# Patient Record
Sex: Female | Born: 1995
Health system: Southern US, Community
[De-identification: ages and names within clinical notes are randomized; demographics above are authoritative.]

## PROBLEM LIST (undated history)

## (undated) DIAGNOSIS — S93401A Sprain of unspecified ligament of right ankle, initial encounter: Secondary | ICD-10-CM

## (undated) DIAGNOSIS — K37 Unspecified appendicitis: Secondary | ICD-10-CM

## (undated) DIAGNOSIS — S76319A Strain of muscle, fascia and tendon of the posterior muscle group at thigh level, unspecified thigh, initial encounter: Secondary | ICD-10-CM

## (undated) DIAGNOSIS — S52599A Other fractures of lower end of unspecified radius, initial encounter for closed fracture: Secondary | ICD-10-CM

## (undated) DIAGNOSIS — Z789 Other specified health status: Secondary | ICD-10-CM

## (undated) DIAGNOSIS — IMO0002 Reserved for concepts with insufficient information to code with codable children: Secondary | ICD-10-CM

## (undated) DIAGNOSIS — S93409A Sprain of unspecified ligament of unspecified ankle, initial encounter: Secondary | ICD-10-CM

## (undated) DIAGNOSIS — S63639A Sprain of interphalangeal joint of unspecified finger, initial encounter: Secondary | ICD-10-CM

## (undated) HISTORY — DX: Strain of muscle, fascia and tendon of the posterior muscle group at thigh level, unspecified thigh, initial encounter: S76.319A

## (undated) HISTORY — DX: Unspecified appendicitis: K37

## (undated) HISTORY — DX: Reserved for concepts with insufficient information to code with codable children: IMO0002

## (undated) HISTORY — DX: Sprain of interphalangeal joint of unspecified finger, initial encounter: S63.639A

## (undated) HISTORY — DX: Other fractures of lower end of unspecified radius, initial encounter for closed fracture: S52.599A

## (undated) HISTORY — DX: Sprain of unspecified ligament of right ankle, initial encounter: S93.401A

## (undated) HISTORY — DX: Sprain of unspecified ligament of unspecified ankle, initial encounter: S93.409A

---
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
## 2015-05-13 DIAGNOSIS — N946 Dysmenorrhea, unspecified: Secondary | ICD-10-CM | POA: Diagnosis not present

## 2015-05-13 DIAGNOSIS — Z01419 Encounter for gynecological examination (general) (routine) without abnormal findings: Secondary | ICD-10-CM | POA: Diagnosis not present

## 2015-05-13 DIAGNOSIS — Z113 Encounter for screening for infections with a predominantly sexual mode of transmission: Secondary | ICD-10-CM | POA: Diagnosis not present

## 2015-05-13 DIAGNOSIS — Z130 Encounter for screening for diseases of the blood and blood-forming organs and certain disorders involving the immune mechanism: Secondary | ICD-10-CM | POA: Diagnosis not present

## 2015-05-13 DIAGNOSIS — Z202 Contact with and (suspected) exposure to infections with a predominantly sexual mode of transmission: Secondary | ICD-10-CM | POA: Diagnosis not present

## 2015-05-13 DIAGNOSIS — Z6823 Body mass index (BMI) 23.0-23.9, adult: Secondary | ICD-10-CM | POA: Diagnosis not present

## 2015-05-13 DIAGNOSIS — Z1389 Encounter for screening for other disorder: Secondary | ICD-10-CM | POA: Diagnosis not present

## 2015-05-13 DIAGNOSIS — Z3009 Encounter for other general counseling and advice on contraception: Secondary | ICD-10-CM | POA: Diagnosis not present

## 2015-05-13 DIAGNOSIS — N920 Excessive and frequent menstruation with regular cycle: Secondary | ICD-10-CM | POA: Diagnosis not present

## 2015-05-16 MED FILL — AZITHROMYCIN 500 MG TABLET: 500 | 1 days supply | Qty: 2 | Fill #0

## 2015-05-20 ENCOUNTER — Ambulatory Visit (INDEPENDENT_AMBULATORY_CARE_PROVIDER_SITE_OTHER): Payer: 59

## 2015-05-20 ENCOUNTER — Ambulatory Visit (INDEPENDENT_AMBULATORY_CARE_PROVIDER_SITE_OTHER): Payer: 59 | Admitting: Physician Assistant

## 2015-05-20 VITALS — BP 121/77 | HR 64 | Temp 97.9°F | Resp 16 | Ht 69.0 in | Wt 161.0 lb

## 2015-05-20 DIAGNOSIS — M25471 Effusion, right ankle: Secondary | ICD-10-CM | POA: Diagnosis not present

## 2015-05-20 DIAGNOSIS — S99911A Unspecified injury of right ankle, initial encounter: Secondary | ICD-10-CM | POA: Diagnosis not present

## 2015-05-20 DIAGNOSIS — S93401A Sprain of unspecified ligament of right ankle, initial encounter: Secondary | ICD-10-CM

## 2015-05-20 DIAGNOSIS — M25571 Pain in right ankle and joints of right foot: Secondary | ICD-10-CM | POA: Diagnosis not present

## 2015-05-20 DIAGNOSIS — S99921A Unspecified injury of right foot, initial encounter: Secondary | ICD-10-CM | POA: Diagnosis not present

## 2015-05-20 DIAGNOSIS — Z23 Encounter for immunization: Secondary | ICD-10-CM | POA: Diagnosis not present

## 2015-05-20 NOTE — Patient Instructions (Addendum)
Because you received an x-ray today, you will receive an invoice from [facility]. Please contact [facility] at [phone] with questions or concerns regarding your invoice. Our billing staff will not be able to assist you with those questions.  Use the boot and the crutches to prevent any weight on the ankle until you find that you can support your weight in the boot alone without pain. When you can bear weight on the ankle without the boot (you can test this out when you are in the shower), you can eliminate it.  Once your pain is resolved, resume the exercises you have to strengthen the ankle and reduce the risk of repeat injury.

## 2015-05-20 NOTE — Progress Notes (Signed)
Subjective:   Patient ID: Angela Blake, female     DOB: March 29, 1996, 20 y.o.    MRN: [id number]  PCP: Elon Jester, MD  Chief Complaint  Patient presents with  . Ankle Injury    right    HPI  Presents for evaluation of RIGHT lateral ankle pain and swelling after inversion injury today.  She is accompanied by a friend, and her father is also here, but not in the exam room the entire time.  She was walking on somewhat uneven ground in lace-up shoes with a 1.5 inch wedge-type heel. Has sprained both ankles previously, this one multiple times. Pain prevents ambulation, and she is brought into the office in a wheel chair.  Prior to Admission medications   Not on File     No Known Allergies   Patient Active Problem List   Diagnosis Date Noted  . Right ankle sprain 02/09/2012  . Strain of hip and thigh 12/28/2011  . Hamstring muscle strain 08/04/2011  . CLOS FRACTURE MID/PROXIMAL PHALANX/PHALANG HAND 03/24/2010  . SPRAIN AND STRAIN OF INTERPHALANGEAL OF HAND 03/24/2010  . OTHER CLOSED FRACTURES OF DISTAL END OF RADIUS 10/30/2009  . CONTUSION OF WRIST 10/30/2009  . CONTUSION OF KNEE 01/06/2009  . SPRAIN/STRAIN, ANKLE NOS 01/26/2007  . PAIN IN JOINT, ANKLE/FOOT 01/19/2007     No family history on file.   Social History   Social History  . Marital Status: Single    Spouse Name: n/a  . Number of Children: 0  . Years of Education: N/A   Occupational History  . 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 Smoking status: Never Smoker   . Smokeless tobacco: Never Used  . Alcohol Use: No  . Drug Use: No  . Sexual Activity: Not on file   Other Topics Concern  . Not on file   Social History Narrative   Lives on campus at [place]. Parents live locally.        Review of Systems As above.      Objective:  Physical Exam  Constitutional: She is oriented to person, place, and time. She appears well-developed and  well-nourished. She is active and cooperative. No distress.  BP 121/77 mmHg  Pulse 64  Temp(Src) 97.9 F (36.6 C)  Resp 16  Ht  (1.753 m)  Wt 161 lb (73.029 kg)  BMI 23.76 kg/m2   Eyes: Conjunctivae are normal.  Pulmonary/Chest: Effort normal.  Musculoskeletal:       Right ankle: She exhibits decreased range of motion and swelling. She exhibits no ecchymosis, no laceration and normal pulse. Tenderness. Lateral malleolus, AITFL, CF ligament, posterior TFL and head of 5th metatarsal tenderness found. No medial malleolus and no proximal fibula tenderness found. Achilles tendon normal.       Right foot: There is bony tenderness (5th metarsal). There is normal range of motion, no tenderness, no swelling, normal capillary refill, no crepitus and no laceration.  Neurological: She is alert and oriented to person, place, and time.  Skin: Skin is warm, dry and intact. No abrasion, no ecchymosis and no rash noted. No cyanosis. Nails show no clubbing.  Psychiatric: She has a normal mood and affect. Her speech is normal and behavior is normal.    Dg Ankle Complete Right  05/20/2015  CLINICAL DATA:  Pain and swelling following inversion injury earlier today EXAM: RIGHT ANKLE - COMPLETE 3+ VIEW COMPARISON:  February 09, 2012 FINDINGS: Frontal, oblique,  and lateral views were obtained. There is diffuse swelling laterally. There is a small joint effusion. No fracture is evident. Ankle mortise appears intact. No appreciable joint space narrowing. IMPRESSION: Marked swelling laterally with small joint effusion. Suspect a degree of ligamentous injury. No evident fracture. Mortise appears intact. Electronically Signed   By: Bretta Bang III M.D.   On: 05/20/2015 17:58   Dg Foot Complete Right  05/20/2015  CLINICAL DATA:  Right foot injury today. EXAM: RIGHT FOOT COMPLETE - 3+ VIEW COMPARISON:  None. FINDINGS: There is no evidence of fracture or dislocation. There is no evidence of arthropathy or other  focal bone abnormality. Soft tissues are unremarkable. IMPRESSION: Negative. Electronically Signed   By: Sherian Rein M.D.   On: 05/20/2015 17:58            Assessment & Plan:  1. Right ankle sprain, initial encounter No bony deformity of x-rays today. Short CAM walker. Crutches. Non-weight bearing, advance as tolerated. Rest, ice, elevate. RTC in 4 weeks if any pain persists, sooner if not progressing. Once pain is resolved, resume HEP for ankle strengthening to reduce risk of re-injury. - DG Ankle Complete Right; Future - DG Foot Complete Right; Future  2. Need for influenza vaccination - Flu Vaccine QUAD 36+ mos IM   Fernande Bras, PA-C Physician Assistant-Certified [facility]

## 2015-05-21 DIAGNOSIS — Z3202 Encounter for pregnancy test, result negative: Secondary | ICD-10-CM | POA: Diagnosis not present

## 2015-05-21 DIAGNOSIS — Z3046 Encounter for surveillance of implantable subdermal contraceptive: Secondary | ICD-10-CM | POA: Diagnosis not present

## 2015-08-11 DIAGNOSIS — Z3046 Encounter for surveillance of implantable subdermal contraceptive: Secondary | ICD-10-CM | POA: Diagnosis not present

## 2015-10-22 DIAGNOSIS — H5203 Hypermetropia, bilateral: Secondary | ICD-10-CM | POA: Diagnosis not present

## 2016-01-25 ENCOUNTER — Emergency Department
Admission: EM | Admit: 2016-01-25 | Discharge: 2016-01-25 | Disposition: A | Discharge status: Home / Self Care | Payer: 59 | Attending: Emergency Medicine | Admitting: Emergency Medicine

## 2016-01-25 ENCOUNTER — Observation Stay (HOSPITAL_COMMUNITY)
Admission: EM | Admit: 2016-01-25 | Discharge: 2016-01-27 | Disposition: A | Discharge status: Home / Self Care | Payer: 59 | Attending: Surgery | Admitting: Surgery

## 2016-01-25 ENCOUNTER — Emergency Department (HOSPITAL_COMMUNITY): Payer: 59

## 2016-01-25 ENCOUNTER — Encounter (HOSPITAL_COMMUNITY): Payer: Self-pay | Admitting: Emergency Medicine

## 2016-01-25 ENCOUNTER — Encounter: Payer: Self-pay | Admitting: Emergency Medicine

## 2016-01-25 DIAGNOSIS — K37 Unspecified appendicitis: Secondary | ICD-10-CM | POA: Diagnosis present

## 2016-01-25 DIAGNOSIS — R112 Nausea with vomiting, unspecified: Secondary | ICD-10-CM | POA: Insufficient documentation

## 2016-01-25 DIAGNOSIS — Z793 Long term (current) use of hormonal contraceptives: Secondary | ICD-10-CM | POA: Insufficient documentation

## 2016-01-25 DIAGNOSIS — Z5321 Procedure and treatment not carried out due to patient leaving prior to being seen by health care provider: Secondary | ICD-10-CM | POA: Diagnosis not present

## 2016-01-25 DIAGNOSIS — K353 Acute appendicitis with localized peritonitis: Secondary | ICD-10-CM | POA: Diagnosis not present

## 2016-01-25 DIAGNOSIS — Z23 Encounter for immunization: Secondary | ICD-10-CM | POA: Diagnosis not present

## 2016-01-25 DIAGNOSIS — R197 Diarrhea, unspecified: Secondary | ICD-10-CM

## 2016-01-25 DIAGNOSIS — R109 Unspecified abdominal pain: Secondary | ICD-10-CM | POA: Diagnosis present

## 2016-01-25 DIAGNOSIS — R1013 Epigastric pain: Secondary | ICD-10-CM | POA: Diagnosis present

## 2016-01-25 DIAGNOSIS — K3580 Unspecified acute appendicitis: Secondary | ICD-10-CM | POA: Diagnosis not present

## 2016-01-25 HISTORY — DX: Other specified health status: Z78.9

## 2016-01-25 LAB — COMPREHENSIVE METABOLIC PANEL
ALT: 16 U/L (ref 14–54)
ANION GAP: 11 (ref 5–15)
AST: 25 U/L (ref 15–41)
Albumin: 5.5 g/dL — ABNORMAL HIGH (ref 3.5–5.0)
Alkaline Phosphatase: 67 U/L (ref 38–126)
BILIRUBIN TOTAL: 1.2 mg/dL (ref 0.3–1.2)
BUN: 9 mg/dL (ref 6–20)
CHLORIDE: 105 mmol/L (ref 101–111)
CO2: 24 mmol/L (ref 22–32)
Calcium: 9.7 mg/dL (ref 8.9–10.3)
Creatinine, Ser: 0.83 mg/dL (ref 0.44–1.00)
Glucose, Bld: 111 mg/dL — ABNORMAL HIGH (ref 65–99)
POTASSIUM: 3.3 mmol/L — AB (ref 3.5–5.1)
Sodium: 140 mmol/L (ref 135–145)
TOTAL PROTEIN: 9.1 g/dL — AB (ref 6.5–8.1)

## 2016-01-25 LAB — CBC
HEMATOCRIT: 44 % (ref 35.0–47.0)
HEMOGLOBIN: 14.7 g/dL (ref 12.0–16.0)
MCH: 29.7 pg (ref 26.0–34.0)
MCHC: 33.5 g/dL (ref 32.0–36.0)
MCV: 88.7 fL (ref 80.0–100.0)
Platelets: 288 10*3/uL (ref 150–440)
RBC: 4.96 MIL/uL (ref 3.80–5.20)
RDW: 13 % (ref 11.5–14.5)
WBC: 14.1 10*3/uL — AB (ref 3.6–11.0)

## 2016-01-25 LAB — URINE MICROSCOPIC-ADD ON

## 2016-01-25 LAB — URINALYSIS, ROUTINE W REFLEX MICROSCOPIC
BILIRUBIN URINE: NEGATIVE
Glucose, UA: NEGATIVE mg/dL
Ketones, ur: NEGATIVE mg/dL
NITRITE: NEGATIVE
PH: 6 (ref 5.0–8.0)
Protein, ur: NEGATIVE mg/dL

## 2016-01-25 LAB — LIPASE, BLOOD: LIPASE: 23 U/L (ref 11–51)

## 2016-01-25 LAB — PREGNANCY, URINE: Preg Test, Ur: NEGATIVE

## 2016-01-25 MED ORDER — SODIUM CHLORIDE 0.9 % IV BOLUS (SEPSIS)
1000.0000 mL | Freq: Once | INTRAVENOUS | Status: AC
Start: 2016-01-25 — End: 2016-01-26
  Administered 2016-01-25: 1000 mL via INTRAVENOUS

## 2016-01-25 MED ORDER — MORPHINE SULFATE (PF) 4 MG/ML IV SOLN
4.0000 mg | Freq: Once | INTRAVENOUS | Status: AC
  Administered 2016-01-25: 4 mg via INTRAVENOUS
  Filled 2016-01-25: qty 1

## 2016-01-25 MED ORDER — ONDANSETRON HCL 4 MG/2ML IJ SOLN
4.0000 mg | Freq: Once | INTRAMUSCULAR | Status: AC
  Administered 2016-01-25: 4 mg via INTRAVENOUS
  Filled 2016-01-25: qty 2

## 2016-01-25 NOTE — ED Triage Notes (Signed)
Pt presents to ED with c/o midline epigastric abdominal pain that started today. Pt states sudden onset earlier today with what felt like bloating. Pt states then she vomited and pain got worse. Pt c/o pain worsening with deep breath at this time, also c/o N/V/D as well. Pt is noted to be tearful during triage.

## 2016-01-25 NOTE — ED Notes (Signed)
Pt's mother states they were seen at [facility] earlier and "didn't get the best care" and stated that they "drew bloodwork and placed her back out into lobby with pt doubled over in pain and vomiting". Pt's mother stated she gave her one of her Zofrans earlier. Pt is currently lying in bed with knees bent and is not vomiting. Rates pain as 3/10. Mother remains at bedside.

## 2016-01-25 NOTE — ED Provider Notes (Signed)
TIME SEEN: 11:19 PM  CHIEF COMPLAINT: abdominal pain  By signing my name below, I, Christy SartoriusAnastasia Kolousek, attest that this documentation has been prepared under the direction and in the presence of Enbridge EnergyKristen N Harbor Paster, DO . Electronically Signed: Christy SartoriusAnastasia Kolousek, Scribe. 01/25/2016. 11:29 PM.  HPI:   Angela Blake is a 20 y.o. female with no significant past medical history who presents to the Emergency Department complaining of right-sided abdominal pain.  Her pain has now moved to her lower abdomen.  She notes associated vomiting and diarrhea.  She reports her pain was worse after throwing up.  Lying flat makes it worse.  Pt was seen at [place] earlier today but left before she was seen by a physician. She had a leukocytosis of 14.1 otherwise labs are normal.   A couple days ago pt noted subjective fever.  Pt denies blood in stool, melena, dysuria, hematuria, and abnormal vaginal bleeding or discharge.  Pt denies history of abdominal surgery.      ROS: See HPI Constitutional: no fever  Eyes: no drainage  ENT: no runny nose   Cardiovascular:  no chest pain  Resp: no SOB  GI: Positive vomiting GU: no dysuria Integumentary: no rash  Allergy: no hives  Musculoskeletal: no leg swelling  Neurological: no slurred speech ROS otherwise negative  PAST MEDICAL HISTORY/PAST SURGICAL HISTORY:  History reviewed. No pertinent past medical history.  MEDICATIONS:  Prior to Admission medications   Medication Sig Start Date End Date Taking? Authorizing Provider  etonogestrel (NEXPLANON) 68 MG IMPL implant 1 each by Subdermal route once.   Yes Historical Provider, MD    ALLERGIES:  No Known Allergies  SOCIAL HISTORY:  Social History  Substance Use Topics  . Smoking status: Never Smoker  . Smokeless tobacco: Never Used  . Alcohol use 0.0 oz/week     Comment: Occ    FAMILY HISTORY: History reviewed. No pertinent family history.  EXAM: BP 121/76   Pulse 93   Temp 97.7 F (36.5 C) (Oral)    Resp 18   Ht 5\' 9"  (1.753 m)   Wt 145 lb (65.8 kg)   LMP  (LMP Unknown)   SpO2 98%   BMI 21.41 kg/m  CONSTITUTIONAL: Alert and oriented and responds appropriately to questions. Appears uncomfortable but is nontoxic HEAD: Normocephalic EYES: Conjunctivae clear, PERRL ENT: normal nose; no rhinorrhea; moist mucous membranes NECK: Supple, no meningismus, no LAD  CARD: RRR; S1 and S2 appreciated; no murmurs, no clicks, no rubs, no gallops RESP: Normal chest excursion without splinting or tachypnea; breath sounds clear and equal bilaterally; no wheezes, no rhonchi, no rales, no hypoxia or respiratory distress, speaking full sentences ABD/GI: Normal bowel sounds; non-distended; soft, tender to palpation in the right mid and right lower quadrant with guarding, no rebound, negative Murphy sign, no peritoneal signs BACK:  The back appears normal and is non-tender to palpation, there is no CVA tenderness EXT: Normal ROM in all joints; non-tender to palpation; no edema; normal capillary refill; no cyanosis, no calf tenderness or swelling   SKIN: Normal color for age and race; warm; no rash NEURO: Moves all extremities equally, sensation to light touch intact diffusely, cranial nerves II through XII intact PSYCH: The patient's mood and manner are appropriate. Grooming and personal hygiene are appropriate.  MEDICAL DECISION MAKING: Patient here with right-sided abdominal pain. Differential includes appendicitis, cholecystitis, less likely pancreatitis, colitis, diverticulitis. Urine shows blood and large leukocytes but she has no urinary symptoms. Rare bacteria in her urine  and many squamous cells. Labs from [facility] showed a leukocytosis of 14.1 but were otherwise unremarkable. LFTs and lipase were normal. We'll proceed with CT imaging for further evaluation. Will give IV fluids, pain and nausea medicine.  ED PROGRESS: CTAP shows acute appendicitis. Discussed with Dr. Karie Kirks with radiology. She  states there is free fluid in the abdomen but it is low-density and she suspects that it is likely from a ruptured adnexal cyst. Patient remains mechanically stable with a non-peritoneal abdomen. We'll give IV Zosyn and discuss with general surgery for admission.  2:33 AM  Discussed patient's case with surgeon, Dr. Earlene Plater.  Recommend admission to medical/surgical, observation bed.  I will place holding orders per their request. Patient and family (if present) updated with plan. Care transferred to surgery service.  I reviewed all nursing notes, vitals, pertinent old records, EKGs, labs, imaging (as available).     I personally performed the services described in this documentation, which was scribed in my presence. The recorded information has been reviewed and is accurate.     Layla Maw Johnnie Goynes, DO 01/26/16 [phone]

## 2016-01-25 NOTE — ED Triage Notes (Signed)
Pt reports epigastric pain and RUQ with vomiting and diarrhea that started today.

## 2016-01-26 ENCOUNTER — Encounter (HOSPITAL_COMMUNITY)
Admission: EM | Disposition: A | Discharge status: Home / Self Care | Payer: Self-pay | Source: Home / Self Care | Attending: Emergency Medicine

## 2016-01-26 ENCOUNTER — Observation Stay (HOSPITAL_COMMUNITY): Payer: 59 | Admitting: Anesthesiology

## 2016-01-26 ENCOUNTER — Encounter (HOSPITAL_COMMUNITY): Payer: Self-pay | Admitting: *Deleted

## 2016-01-26 DIAGNOSIS — Z23 Encounter for immunization: Secondary | ICD-10-CM | POA: Diagnosis not present

## 2016-01-26 DIAGNOSIS — K37 Unspecified appendicitis: Secondary | ICD-10-CM

## 2016-01-26 DIAGNOSIS — K3580 Unspecified acute appendicitis: Secondary | ICD-10-CM | POA: Diagnosis not present

## 2016-01-26 DIAGNOSIS — Z793 Long term (current) use of hormonal contraceptives: Secondary | ICD-10-CM | POA: Diagnosis not present

## 2016-01-26 DIAGNOSIS — R1010 Upper abdominal pain, unspecified: Secondary | ICD-10-CM | POA: Diagnosis not present

## 2016-01-26 DIAGNOSIS — K353 Acute appendicitis with localized peritonitis: Secondary | ICD-10-CM | POA: Diagnosis not present

## 2016-01-26 HISTORY — PX: LAPAROSCOPIC APPENDECTOMY: SHX408

## 2016-01-26 HISTORY — DX: Unspecified appendicitis: K37

## 2016-01-26 SURGERY — APPENDECTOMY, LAPAROSCOPIC
Anesthesia: General

## 2016-01-26 MED ORDER — MORPHINE SULFATE (PF) 4 MG/ML IV SOLN
4.0000 mg | INTRAVENOUS | Status: DC | PRN
  Administered 2016-01-26 – 2016-01-27 (×4): 4 mg via INTRAVENOUS
  Filled 2016-01-26 (×4): qty 1

## 2016-01-26 MED ORDER — OXYCODONE-ACETAMINOPHEN 5-325 MG PO TABS
1.0000 | ORAL_TABLET | ORAL | Status: DC | PRN
  Administered 2016-01-26 – 2016-01-27 (×3): 2 via ORAL
  Filled 2016-01-26 (×4): qty 2

## 2016-01-26 MED ORDER — SODIUM CHLORIDE 0.9 % IV SOLN: INTRAVENOUS | Status: AC

## 2016-01-26 MED ORDER — CHLORHEXIDINE GLUCONATE CLOTH 2 % EX PADS
6.0000 | MEDICATED_PAD | Freq: Once | CUTANEOUS | Status: DC
Start: 2016-01-26 — End: 2016-01-26

## 2016-01-26 MED ORDER — LIDOCAINE HCL (PF) 1 % IJ SOLN
INTRAMUSCULAR | Status: AC
  Filled 2016-01-26: qty 5

## 2016-01-26 MED ORDER — ROCURONIUM BROMIDE 100 MG/10ML IV SOLN
INTRAVENOUS | Status: DC | PRN
  Administered 2016-01-26: 30 mg via INTRAVENOUS
  Administered 2016-01-26: 5 mg via INTRAVENOUS

## 2016-01-26 MED ORDER — HYDROCORTISONE NA SUCCINATE PF 100 MG IJ SOLR
INTRAMUSCULAR | Status: AC
  Filled 2016-01-26: qty 2

## 2016-01-26 MED ORDER — LIDOCAINE HCL (CARDIAC) 10 MG/ML IV SOLN
INTRAVENOUS | Status: DC | PRN
  Administered 2016-01-26: 50 mg via INTRAVENOUS

## 2016-01-26 MED ORDER — MORPHINE SULFATE (PF) 4 MG/ML IV SOLN
INTRAVENOUS | Status: AC
  Filled 2016-01-26: qty 1

## 2016-01-26 MED ORDER — FENTANYL CITRATE (PF) 100 MCG/2ML IJ SOLN
INTRAMUSCULAR | Status: DC | PRN
  Administered 2016-01-26: 100 ug via INTRAVENOUS
  Administered 2016-01-26: 50 ug via INTRAVENOUS
  Administered 2016-01-26: 25 ug via INTRAVENOUS

## 2016-01-26 MED ORDER — FENTANYL CITRATE (PF) 100 MCG/2ML IJ SOLN
INTRAMUSCULAR | Status: AC
  Filled 2016-01-26: qty 2

## 2016-01-26 MED ORDER — MIDAZOLAM HCL 2 MG/2ML IJ SOLN
INTRAMUSCULAR | Status: AC
  Filled 2016-01-26: qty 2

## 2016-01-26 MED ORDER — BUPIVACAINE HCL (PF) 0.5 % IJ SOLN
INTRAMUSCULAR | Status: AC
  Filled 2016-01-26: qty 30

## 2016-01-26 MED ORDER — ONDANSETRON HCL 4 MG/2ML IJ SOLN
4.0000 mg | Freq: Four times a day (QID) | INTRAMUSCULAR | Status: DC | PRN
  Filled 2016-01-26 (×2): qty 2

## 2016-01-26 MED ORDER — NEOSTIGMINE METHYLSULFATE 10 MG/10ML IV SOLN
INTRAVENOUS | Status: DC | PRN
  Administered 2016-01-26: 3 mg via INTRAVENOUS

## 2016-01-26 MED ORDER — PROPOFOL 10 MG/ML IV BOLUS
INTRAVENOUS | Status: DC | PRN
  Administered 2016-01-26: 20 mg via INTRAVENOUS
  Administered 2016-01-26: 30 mg via INTRAVENOUS
  Administered 2016-01-26: 150 mg via INTRAVENOUS

## 2016-01-26 MED ORDER — LIDOCAINE HCL (PF) 1 % IJ SOLN
INTRAMUSCULAR | Status: AC
  Filled 2016-01-26: qty 30

## 2016-01-26 MED ORDER — OXYCODONE-ACETAMINOPHEN 5-325 MG PO TABS: 1.0000 | ORAL_TABLET | ORAL | 0 refills | Status: DC | PRN

## 2016-01-26 MED ORDER — HYDROMORPHONE HCL 1 MG/ML IJ SOLN: 0.2500 mg | INTRAMUSCULAR | Status: DC | PRN

## 2016-01-26 MED ORDER — ACETAMINOPHEN 325 MG PO TABS: 650.0000 mg | ORAL_TABLET | Freq: Four times a day (QID) | ORAL | Status: DC | PRN

## 2016-01-26 MED ORDER — LACTATED RINGERS IV SOLN
INTRAVENOUS | Status: DC
  Administered 2016-01-26: 14:00:00 via INTRAVENOUS

## 2016-01-26 MED ORDER — PIPERACILLIN-TAZOBACTAM 3.375 G IVPB
3.3750 g | Freq: Three times a day (TID) | INTRAVENOUS | Status: DC
  Administered 2016-01-26 – 2016-01-27 (×3): 3.375 g via INTRAVENOUS
  Filled 2016-01-26 (×3): qty 50

## 2016-01-26 MED ORDER — DEXAMETHASONE SODIUM PHOSPHATE 4 MG/ML IJ SOLN
INTRAMUSCULAR | Status: AC
  Filled 2016-01-26: qty 2

## 2016-01-26 MED ORDER — SODIUM CHLORIDE 0.9 % IJ SOLN
INTRAMUSCULAR | Status: AC
  Filled 2016-01-26: qty 10

## 2016-01-26 MED ORDER — ONDANSETRON HCL 4 MG/2ML IJ SOLN
INTRAMUSCULAR | Status: AC
  Filled 2016-01-26: qty 6

## 2016-01-26 MED ORDER — GLYCOPYRROLATE 0.2 MG/ML IJ SOLN
INTRAMUSCULAR | Status: DC | PRN
  Administered 2016-01-26: 0.6 mg via INTRAVENOUS

## 2016-01-26 MED ORDER — GLYCOPYRROLATE 0.2 MG/ML IJ SOLN
INTRAMUSCULAR | Status: AC
  Filled 2016-01-26: qty 1

## 2016-01-26 MED ORDER — LIDOCAINE HCL 1 % IJ SOLN
INTRAMUSCULAR | Status: DC | PRN
  Administered 2016-01-26: 20 mL via INTRAMUSCULAR

## 2016-01-26 MED ORDER — ONDANSETRON HCL 4 MG/2ML IJ SOLN
4.0000 mg | Freq: Once | INTRAMUSCULAR | Status: AC
  Administered 2016-01-26: 4 mg via INTRAVENOUS

## 2016-01-26 MED ORDER — SODIUM CHLORIDE 0.9 % IR SOLN
Status: DC | PRN
  Administered 2016-01-26: 1000 mL

## 2016-01-26 MED ORDER — PROPOFOL 10 MG/ML IV BOLUS
INTRAVENOUS | Status: AC
  Filled 2016-01-26: qty 20

## 2016-01-26 MED ORDER — MIDAZOLAM HCL 2 MG/2ML IJ SOLN
1.0000 mg | INTRAMUSCULAR | Status: DC | PRN
  Administered 2016-01-26 (×2): 1 mg via INTRAVENOUS

## 2016-01-26 MED ORDER — CHLORHEXIDINE GLUCONATE CLOTH 2 % EX PADS: 6.0000 | MEDICATED_PAD | Freq: Once | CUTANEOUS | Status: DC

## 2016-01-26 MED ORDER — INFLUENZA VAC SPLIT QUAD 0.5 ML IM SUSY
0.5000 mL | PREFILLED_SYRINGE | INTRAMUSCULAR | Status: AC
  Administered 2016-01-27: 0.5 mL via INTRAMUSCULAR
  Filled 2016-01-26: qty 0.5

## 2016-01-26 MED ORDER — MORPHINE SULFATE (PF) 4 MG/ML IV SOLN
4.0000 mg | Freq: Once | INTRAVENOUS | Status: AC
  Administered 2016-01-26: 4 mg via INTRAVENOUS

## 2016-01-26 MED ORDER — IOPAMIDOL (ISOVUE-300) INJECTION 61%: 100.0000 mL | Freq: Once | INTRAVENOUS | Status: DC | PRN

## 2016-01-26 MED ORDER — PIPERACILLIN-TAZOBACTAM 3.375 G IVPB
3.3750 g | Freq: Once | INTRAVENOUS | Status: AC
  Administered 2016-01-26: 3.375 g via INTRAVENOUS
  Filled 2016-01-26: qty 50

## 2016-01-26 MED ORDER — SODIUM CHLORIDE 0.9 % IV SOLN
INTRAVENOUS | Status: AC
  Administered 2016-01-26: 03:00:00 via INTRAVENOUS
  Administered 2016-01-26: 1000 mL via INTRAVENOUS

## 2016-01-26 SURGICAL SUPPLY — 43 items
BAG HAMPER (MISCELLANEOUS) ×3 IMPLANT
CHLORAPREP W/TINT 26ML (MISCELLANEOUS) ×3 IMPLANT
CLOTH BEACON ORANGE TIMEOUT ST (SAFETY) ×3 IMPLANT
COVER LIGHT HANDLE STERIS (MISCELLANEOUS) ×6 IMPLANT
CUTTER FLEX LINEAR 45M (STAPLE) ×3 IMPLANT
DECANTER SPIKE VIAL GLASS SM (MISCELLANEOUS) ×6 IMPLANT
DERMABOND ADVANCED (GAUZE/BANDAGES/DRESSINGS) ×2
DERMABOND ADVANCED .7 DNX12 (GAUZE/BANDAGES/DRESSINGS) ×1 IMPLANT
DEVICE TROCAR PUNCTURE CLOSURE (ENDOMECHANICALS) ×3 IMPLANT
ELECT REM PT RETURN 9FT ADLT (ELECTROSURGICAL) ×3
ELECTRODE REM PT RTRN 9FT ADLT (ELECTROSURGICAL) ×1 IMPLANT
EVACUATOR SMOKE 8.L (FILTER) ×3 IMPLANT
FORMALIN 10 PREFIL 120ML (MISCELLANEOUS) ×3 IMPLANT
GLOVE BIOGEL PI IND STRL 7.0 (GLOVE) ×1 IMPLANT
GLOVE BIOGEL PI IND STRL 7.5 (GLOVE) ×1 IMPLANT
GLOVE BIOGEL PI INDICATOR 7.0 (GLOVE) ×2
GLOVE BIOGEL PI INDICATOR 7.5 (GLOVE) ×2
GLOVE ECLIPSE 6.5 STRL STRAW (GLOVE) ×3 IMPLANT
GLOVE ECLIPSE 7.0 STRL STRAW (GLOVE) ×3 IMPLANT
GOWN STRL REUS W/ TWL XL LVL3 (GOWN DISPOSABLE) ×1 IMPLANT
GOWN STRL REUS W/TWL LRG LVL3 (GOWN DISPOSABLE) ×3 IMPLANT
GOWN STRL REUS W/TWL XL LVL3 (GOWN DISPOSABLE) ×2
INST SET LAPROSCOPIC AP (KITS) ×3 IMPLANT
KIT ROOM TURNOVER APOR (KITS) ×3 IMPLANT
MANIFOLD NEPTUNE II (INSTRUMENTS) ×3 IMPLANT
NEEDLE INSUFFLATION 14GA 120MM (NEEDLE) ×3 IMPLANT
NS IRRIG 1000ML POUR BTL (IV SOLUTION) ×3 IMPLANT
PACK LAP CHOLE LZT030E (CUSTOM PROCEDURE TRAY) ×3 IMPLANT
PAD ARMBOARD 7.5X6 YLW CONV (MISCELLANEOUS) ×3 IMPLANT
POUCH SPECIMEN RETRIEVAL 10MM (ENDOMECHANICALS) ×3 IMPLANT
RELOAD STAPLE TA45 3.5 REG BLU (ENDOMECHANICALS) ×3 IMPLANT
SET BASIN LINEN APH (SET/KITS/TRAYS/PACK) ×3 IMPLANT
SHEARS HARMONIC ACE PLUS 36CM (ENDOMECHANICALS) ×3 IMPLANT
SLEEVE ENDOPATH XCEL 5M (ENDOMECHANICALS) ×3 IMPLANT
SUT VIC AB 4-0 PS2 27 (SUTURE) ×3 IMPLANT
SUT VICRYL 0 UR6 27IN ABS (SUTURE) ×3 IMPLANT
SUT VICRYL AB 3-0 FS1 BRD 27IN (SUTURE) ×3 IMPLANT
TRAY FOLEY CATH SILVER 16FR (SET/KITS/TRAYS/PACK) ×3 IMPLANT
TROCAR ENDO BLADELESS 12MM (ENDOMECHANICALS) ×3 IMPLANT
TROCAR XCEL NON-BLD 5MMX100MML (ENDOMECHANICALS) ×3 IMPLANT
TUBING INSUFFLATION (TUBING) ×3 IMPLANT
WARMER LAPAROSCOPE (MISCELLANEOUS) ×3 IMPLANT
YANKAUER SUCT 12FT TUBE ARGYLE (SUCTIONS) ×3 IMPLANT

## 2016-01-26 NOTE — Anesthesia Procedure Notes (Signed)
Procedure Name: Intubation Date/Time: 01/26/2016 3:08 PM Performed by: Franco NonesYATES, Quinzell Malcomb S Pre-anesthesia Checklist: Patient identified, Patient being monitored, Timeout performed, Emergency Drugs available and Suction available Patient Re-evaluated:Patient Re-evaluated prior to inductionOxygen Delivery Method: Circle System Utilized Preoxygenation: Pre-oxygenation with 100% oxygen Intubation Type: IV induction, Rapid sequence and Cricoid Pressure applied Ventilation: Mask ventilation without difficulty Laryngoscope Size: Mac and 3 Grade View: Grade I Tube type: Oral Tube size: 7.0 mm Number of attempts: 1 Airway Equipment and Method: stylet and Oral airway Placement Confirmation: ETT inserted through vocal cords under direct vision,  positive ETCO2 and breath sounds checked- equal and bilateral Secured at: 21 cm Tube secured with: Tape Dental Injury: Teeth and Oropharynx as per pre-operative assessment

## 2016-01-26 NOTE — Anesthesia Preprocedure Evaluation (Signed)
Anesthesia Evaluation  Patient identified by MRN, date of birth, ID band Patient awake    Reviewed: Allergy & Precautions, NPO status , Patient's Chart, lab work & pertinent test results  Airway Mallampati: II  TM Distance: >3 FB     Dental  (+) Teeth Intact   Pulmonary neg pulmonary ROS,    breath sounds clear to auscultation       Cardiovascular negative cardio ROS   Rhythm:Regular Rate:Normal     Neuro/Psych    GI/Hepatic negative GI ROS,   Endo/Other    Renal/GU      Musculoskeletal   Abdominal   Peds  Hematology   Anesthesia Other Findings   Reproductive/Obstetrics                             Anesthesia Physical Anesthesia Plan  ASA: I  Anesthesia Plan: General   Post-op Pain Management:    Induction: Intravenous, Rapid sequence and Cricoid pressure planned  Airway Management Planned: Oral ETT  Additional Equipment:   Intra-op Plan:   Post-operative Plan: Extubation in OR  Informed Consent: I have reviewed the patients History and Physical, chart, labs and discussed the procedure including the risks, benefits and alternatives for the proposed anesthesia with the patient or authorized representative who has indicated his/her understanding and acceptance.     Plan Discussed with:   Anesthesia Plan Comments:         Anesthesia Quick Evaluation

## 2016-01-26 NOTE — H&P (Signed)
SURGICAL HISTORY & PHYSICAL (cpt [id number])  HISTORY OF PRESENT ILLNESS (HPI):  20 y.o. otherwise healthy female presented with acute onset of Right-sided abdominal pain that has become more focused in the Right lower quadrant, worse after vomiting and associated with nausea and diarrhea. Patient otherwise currently denies fever/chills, though did note feeling subjectively febrile a few days ago. She also presented to [facility] yesterday, but left prior to being evaluated by a physician.  PAST MEDICAL HISTORY (PMH):  History reviewed. No pertinent past medical history.  Reviewed. Otherwise negative.   PAST SURGICAL HISTORY (PSH):  History reviewed. No pertinent surgical history.  Reviewed. Otherwise negative.   MEDICATIONS:  Prior to Admission medications   Medication Sig Start Date End Date Taking? Authorizing Provider  etonogestrel (NEXPLANON) 68 MG IMPL implant 1 each by Subdermal route once.   Yes Historical Provider, MD     ALLERGIES:  No Known Allergies   SOCIAL HISTORY:  Social History   Social History  . Marital status: Single    Spouse name: n/a  . Number of children: 0  . Years of education: N/A   Occupational History  . student     [employer])   Social History Main Topics  . Smoking status: Never Smoker  . Smokeless tobacco: Never Used  . Alcohol use 0.0 oz/week     Comment: Occ  . Drug use: No  . Sexual activity: Yes    Birth control/ protection: Implant   Other Topics Concern  . Not on file   Social History Narrative   [facility] [place] [facility] [employer]. Parents live locally.    The patient currently resides (home / rehab facility / nursing home): Home  The patient normally is (ambulatory / bedbound) : Ambulatory   FAMILY HISTORY:  History reviewed. No pertinent family history.  Otherwise negative.   REVIEW OF SYSTEMS:  Constitutional: denies any other weight loss, fever, chills, or sweats  Eyes: denies any other  vision changes, history of eye injury  ENT: denies sore throat, hearing problems  Respiratory: denies shortness of breath, wheezing  Cardiovascular: denies chest pain, palpitations  Gastrointestinal: denies N/V, diarrhea  Genitourinary: denies burning with urination or urinary frequency Musculoskeletal: denies any other joint pains or cramps  Skin: Denies any other rashes or skin discolorations  Neurological: denies any other headache, dizziness, weakness  Psychiatric: denies any other depression, anxiety   All other review of systems were otherwise negative.  VITAL SIGNS:  Temp:  [97.7 F (36.5 C)-98.5 F (36.9 C)] 98.5 F (36.9 C) (10/23 0347) Pulse Rate:  [65-100] 66 (10/23 0347) Resp:  [16-18] 18 (10/23 0347) BP: (112-142)/(61-79) 112/61 (10/23 0347) SpO2:  [98 %-100 %] 100 % (10/23 0347) Weight:  [65.8 kg (145 lb)-66 kg (145 lb 8 oz)] 66 kg (145 lb 8 oz) (10/23 0347)     Height: 5\' 9"  (175.3 cm) Weight: 66 kg (145 lb 8 oz) BMI (Calculated): 21.5   INTAKE/OUTPUT:  This shift: No intake/output data recorded.  Last 2 shifts: @IOLAST2SHIFTS @  PHYSICAL EXAM:  Constitutional:  -- Normal body habitus  -- Awake, alert, and oriented x3  Eyes:  -- Pupils equally round and reactive to light  -- No scleral icterus  Ear, nose, throat:  -- No jugular venous distension  Pulmonary:  -- No crackles  -- Equal breath sounds bilaterally -- Breathing non-labored at rest Cardiovascular:  -- S1, S2 present  -- No pericardial rubs  Gastrointestinal:  -- Abdomen soft and non-distended  with moderate RLQ and suprapubic tenderness to palpation, no guarding/rebound  -- No abdominal masses appreciated, pulsatile or otherwise  Musculoskeletal and Integumentary:  -- Wounds or skin discoloration: None -- Extremities: B/L UE and LE FROM, hands and feet warm, no edema  Neurologic:  -- Motor function: Intact and symmetric -- Sensation: Intact and symmetric  Labs:  CBC:  Lab Results   Component Value Date   WBC 14.1 (H) 01/25/2016   RBC 4.96 01/25/2016   BMP:  Lab Results  Component Value Date   GLUCOSE 111 (H) 01/25/2016   CO2 24 01/25/2016   BUN 9 01/25/2016   CREATININE 0.83 01/25/2016   CALCIUM 9.7 01/25/2016     Imaging studies:  CT Abdomen and Pelvis with Contrast (01/26/2016) STOMACH/BOWEL: The stomach, small and large bowel are normal in course and caliber without inflammatory changes. The appendix is enlarged, 10 mm with hyperemia and periappendiceal fat stranding.  Assessment/Plan: (ICD-10's: K35.3) 20 y.o. otherwise healthy female with acute non-perforated appendicitis with localized peritonitis.    - NPO, IVF, IV antibiotics   - all risks, benefits, and alternatives to above procedure(s) were discussed with the patient and her family, who elect(s) to proceed, all of their questions were answered to their expressed satisfaction, and informed consent was obtained.   - DVT prophylaxis  All of the above findings and recommendations were discussed with the patient and her family, and all of her and family's questions were answered to their expressed satisfaction.  -- Scherrie Gerlach Earlene Plater, MD, RPVI [place]: [facility] General Surgery and Vascular Care Office: [phone]

## 2016-01-26 NOTE — Transfer of Care (Signed)
Immediate Anesthesia Transfer of Care Note  Patient: Angela Blake  Procedure(s) Performed: Procedure(s): APPENDECTOMY LAPAROSCOPIC (N/A)  Patient Location: PACU  Anesthesia Type:General  Level of Consciousness: sedated and patient cooperative  Airway & Oxygen Therapy: Patient Spontanous Breathing and non-rebreather face mask  Post-op Assessment: Report given to RN and Post -op Vital signs reviewed and stable  Post vital signs: Reviewed and stable  Last Vitals:  Vitals:   01/26/16 1445 01/26/16 1450  BP: 110/70 113/65  Pulse:    Resp: 16 15  Temp:      Last Pain:  Vitals:   01/26/16 1351  TempSrc: Oral  PainSc:       Patients Stated Pain Goal: 7 (01/26/16 1351)  Complications: No apparent anesthesia complications

## 2016-01-26 NOTE — Anesthesia Postprocedure Evaluation (Signed)
Anesthesia Post Note  Patient: Angela Blake  Procedure(s) Performed: Procedure(s) (LRB): APPENDECTOMY LAPAROSCOPIC (N/A)  Patient location during evaluation: PACU Anesthesia Type: General Level of consciousness: awake Pain management: satisfactory to patient Vital Signs Assessment: post-procedure vital signs reviewed and stable Respiratory status: spontaneous breathing Cardiovascular status: stable Anesthetic complications: no    Last Vitals:  Vitals:   01/26/16 1630 01/26/16 1640  BP: 117/69 116/68  Pulse: (!) 56 (!) 55  Resp: 15 15  Temp:      Last Pain:  Vitals:   01/26/16 1627  TempSrc:   PainSc: Asleep                 Angela Blake

## 2016-01-26 NOTE — Op Note (Signed)
SURGICAL OPERATIVE REPORT  DATE OF PROCEDURE: 01/26/2016  ATTENDING Surgeon(s): Ancil LinseyJason Evan Salah Nakamura, MD  ANESTHESIA: GETA (General)  PRE-OPERATIVE DIAGNOSIS: Acute non-perforated appendicitis with localized peritonitis (K35.3)  POST-OPERATIVE DIAGNOSIS: Acute non-perforated appendicitis with localized peritonitis (K35.3)  PROCEDURE(S):  1.) Laparoscopic appendectomy (cpt: 44970)  INTRAOPERATIVE FINDINGS: Inflamed non-perforated appendix  INTRAVENOUS FLUIDS: 800 mL crystalloid   ESTIMATED BLOOD LOSS: Minimal (<20 mL)  URINE OUTPUT: 100 mL   SPECIMENS: Appendix  IMPLANTS: None  DRAINS: None  COMPLICATIONS: None apparent  CONDITION AT END OF PROCEDURE: Hemodynamically stable and extubated  DISPOSITION OF PATIENT: PACU  INDICATIONS FOR PROCEDURE:  Patient is a 20 y.o. otherwise reportedly healthy female who presented with acute onset of epigastric abdominal pain that progressed to become greatest at the Right lower quadrant. Patient reports her pain was well-controlled in the Emergency Department. All risks, benefits, and alternatives to above procedure were discussed with the patient, all of patient's questions were answered to his expressed satisfaction, and informed consent was obtained and documented.  DETAILS OF PROCEDURE: Patient was brought to the operating suite and appropriately identified. General anesthesia was administered along with confirmation of appropriate pre-operative antibiotics, and endotracheal intubation was performed by anesthetist, along with NG/OG tube for gastric decompression. In supine position, operative site was prepped and draped in usual sterile fashion, and following a brief time out, initial 5 mm incision was made in a natural skin crease just below the umbilicus. Fascia was then elevated, and a Verress needle was inserted and its proper position confirmed using saline meniscus test prior to abdominal insufflation.  Upon insufflation of the  abdominal cavity with carbon dioxide to a well-tolerated pressure of 12-15 mmHg, a 5 mm peri-umbilical port followed by laparoscope were inserted and used to inspect the abdominal cavity and its contents with no injuries from insertion of the first trochar noted. Two additional trocars were inserted, a 12 mm port at the Left lower quadrant position and another 5 mm port at the suprapubic position. The table was then placed in Trendelenburg position with the Right side up, and blunt graspers were gently used to retract the bowel overlying a clearly inflamed appendix and mild peri-appendiceal inflammation. The appendix was gently retracted by near its tip, and the base of the appendix and mesoappendix were identified in relation to the cecum. The mesoappendix was dissected from the visceral appendix and hemostasis achieved using a harmonic scalpel. Upon freeing the visceral appendix from the mesoappendix, an endostapler loaded with a 45 mm standard tissue load was advanced across the base of the visceral appendix, which was compressed for several seconds, and the stapler was deployed and removed from the abdominal cavity. Hemostasis was confirmed, and the specimen was extracted from the abdominal cavity in a laparoscopic specimen bag.  The intraperitoneal cavity was inspected with no additional findings. Endoclose laparoscopic fascial closure device was then used to re-approximate fascia at the 12 mm Left lower quadrant port site. All ports were then removed under direct visualization, and the abdominal cavity was desuflated. All port sites were irrigated/cleaned, additional local anesthetic was injected at each incision, 3-0 Vicryl was used to re-approximate dermis at 12 mm port site(s), and subcuticular 4-0 Vicryl suture was used to re-approximate skin. Skin was then cleaned, dried, and sterile skin glue was applied. Patient was then safely able to be awakened, extubated, and transferred to PACU for post-operative  monitoring and care.   I was present for all aspects of procedure, and there were no intra-operative complications apparent.

## 2016-01-27 DIAGNOSIS — Z23 Encounter for immunization: Secondary | ICD-10-CM | POA: Diagnosis not present

## 2016-01-27 DIAGNOSIS — Z793 Long term (current) use of hormonal contraceptives: Secondary | ICD-10-CM | POA: Diagnosis not present

## 2016-01-27 DIAGNOSIS — K353 Acute appendicitis with localized peritonitis: Secondary | ICD-10-CM | POA: Diagnosis not present

## 2016-01-27 NOTE — Discharge Summary (Signed)
Physician Discharge Summary  Patient ID: Angela Blake MRN: [id number] DOB/AGE: [birth year] 20 y.o.  Admit date: 01/25/2016 Discharge date: 01/27/2016  Admission Diagnoses:  Discharge Diagnoses:  Active Problems:   Appendicitis   Discharged Condition: good  Hospital Course: Patient presented with Right-sided abdominal pain, for which CT abdomen/pelvis was performed, which demonstrated acute non-perforated appendicitis. Informed consent was obtained, and patient underwent uncomplicated laparoscopic appendectomy. Her post-operative course was uneventful, and discharge planning was initiated.  Consults: general surgery  Significant Diagnostic Studies: radiology: CT scan: acute non-perforated appendicitis  Treatments: IV hydration, antibiotics: Zosyn and surgery: laparoscopic appendectomy  Discharge Exam: Blood pressure (!) 108/55, pulse (!) 44, temperature 97.9 F (36.6 C), temperature source Oral, resp. rate 20, height 5\' 9"  (1.753 m), weight 65.8 kg (145 lb), SpO2 100 %. General appearance: alert, cooperative, appears stated age and no distress GI: soft, mild LLQ peri-incisional tenderness to palpation; incisions well-approximated without erythema or drainage  Disposition: 01-Home or Self Care     Medication List    TAKE these medications   NEXPLANON 68 MG Impl implant Generic drug:  etonogestrel 1 each by Subdermal route once.   oxyCODONE-acetaminophen 5-325 MG tablet Commonly known as:  ROXICET Take 1-2 tablets by mouth every 4 (four) hours as needed for severe pain.      Follow-up Information    Ancil LinseyJason Evan Brentley Horrell, MD. Schedule an appointment as soon as possible for a visit in 2 week(s).   Specialty:  General Surgery Contact information: [address] [place] [facility] [id number] [phone]           Signed: Ancil LinseyJason Evan Linard Daft 01/27/2016, 5:20 PM

## 2016-01-27 NOTE — Progress Notes (Signed)
Pt's IV catheter removed and intact. Pt's IV site clean dry and intact. Discharge instructions including medications and follow up appointments were reviewed and discussed with patient and her father. All questions were answered and no further questions at this time. Pt and her father verbalized understanding of discharge instructions including medications and follow up appointment. Pt in stable condition and in no acute distress at time of discharge. Pt will be escorted by nurse tech.

## 2016-01-29 ENCOUNTER — Encounter (HOSPITAL_COMMUNITY): Payer: Self-pay | Admitting: Surgery

## 2016-03-16 MED FILL — AMOXICILLIN 500 MG CAPSULE: 500 | 7 days supply | Qty: 21 | Fill #0

## 2016-03-16 MED FILL — DEXAMETHASONE 4 MG TABLET: 4 | 3 days supply | Qty: 9 | Fill #0

## 2016-04-07 MED FILL — HYDROCODON-APAP 5-325: 5-325 | 3 days supply | Qty: 15 | Fill #0

## 2016-07-29 MED FILL — AZITHROMYCIN 250 MG TABLET: 250 | 5 days supply | Qty: 6 | Fill #0

## 2016-10-22 DIAGNOSIS — H5213 Myopia, bilateral: Secondary | ICD-10-CM | POA: Diagnosis not present

## 2017-01-01 ENCOUNTER — Emergency Department (HOSPITAL_COMMUNITY): Payer: 59

## 2017-01-01 ENCOUNTER — Emergency Department (HOSPITAL_COMMUNITY)
Admission: EM | Admit: 2017-01-01 | Discharge: 2017-01-01 | Disposition: A | Discharge status: Home / Self Care | Payer: 59 | Attending: Emergency Medicine | Admitting: Emergency Medicine

## 2017-01-01 ENCOUNTER — Encounter (HOSPITAL_COMMUNITY): Payer: Self-pay | Admitting: Emergency Medicine

## 2017-01-01 DIAGNOSIS — S40011A Contusion of right shoulder, initial encounter: Secondary | ICD-10-CM | POA: Diagnosis not present

## 2017-01-01 DIAGNOSIS — M542 Cervicalgia: Secondary | ICD-10-CM | POA: Diagnosis not present

## 2017-01-01 DIAGNOSIS — W03XXXA Other fall on same level due to collision with another person, initial encounter: Secondary | ICD-10-CM | POA: Diagnosis not present

## 2017-01-01 DIAGNOSIS — Y9366 Activity, soccer: Secondary | ICD-10-CM | POA: Insufficient documentation

## 2017-01-01 DIAGNOSIS — Z79899 Other long term (current) drug therapy: Secondary | ICD-10-CM | POA: Insufficient documentation

## 2017-01-01 DIAGNOSIS — Y929 Unspecified place or not applicable: Secondary | ICD-10-CM | POA: Diagnosis not present

## 2017-01-01 DIAGNOSIS — S4991XA Unspecified injury of right shoulder and upper arm, initial encounter: Secondary | ICD-10-CM | POA: Diagnosis not present

## 2017-01-01 DIAGNOSIS — M25511 Pain in right shoulder: Secondary | ICD-10-CM

## 2017-01-01 DIAGNOSIS — Y998 Other external cause status: Secondary | ICD-10-CM | POA: Diagnosis not present

## 2017-01-01 MED ORDER — ACETAMINOPHEN 325 MG PO TABS: 650.0000 mg | ORAL_TABLET | Freq: Four times a day (QID) | ORAL | 0 refills | Status: DC | PRN

## 2017-01-01 MED ORDER — OXYCODONE-ACETAMINOPHEN 5-325 MG PO TABS
1.0000 | ORAL_TABLET | Freq: Once | ORAL | Status: AC
  Administered 2017-01-01: 1 via ORAL
  Filled 2017-01-01: qty 1

## 2017-01-01 MED ORDER — HYDROCODONE-ACETAMINOPHEN 7.5-325 MG/15ML PO SOLN
10.0000 mL | Freq: Once | ORAL | Status: DC
Start: 2017-01-01 — End: 2017-01-01

## 2017-01-01 MED ORDER — HYDROCODONE-ACETAMINOPHEN 5-325 MG PO TABS
ORAL_TABLET | ORAL | Status: AC
  Administered 2017-01-01: 1
  Filled 2017-01-01: qty 1

## 2017-01-01 MED ORDER — NAPROXEN 250 MG PO TABS
500.0000 mg | ORAL_TABLET | Freq: Once | ORAL | Status: AC
  Administered 2017-01-01: 500 mg via ORAL
  Filled 2017-01-01: qty 2

## 2017-01-01 MED ORDER — IBUPROFEN 600 MG PO TABS: 600.0000 mg | ORAL_TABLET | Freq: Four times a day (QID) | ORAL | 0 refills | Status: DC

## 2017-01-01 MED ORDER — HYDROCODONE-ACETAMINOPHEN 5-325 MG PO TABS: 1.0000 | ORAL_TABLET | Freq: Two times a day (BID) | ORAL | 0 refills | Status: DC | PRN

## 2017-01-01 NOTE — ED Triage Notes (Signed)
Pt. Stated, I was playin soccer and I was trying to jump over a player to avoid a hit, and I came down on my right shoulder.. Pain in rt. Shoulder , clavicle and rt. Side of neck

## 2017-01-01 NOTE — ED Provider Notes (Signed)
[facility]-EMERGENCY DEPT Provider Note   CSN: [id number] Arrival date & time: 01/01/17  1519     History   Chief Complaint Chief Complaint  Patient presents with  . Shoulder Pain  . Shoulder Injury  . Clavicle Injury    HPI Angela Blake is a 21 y.o. female.  HPI Pt with no medical hx comes in with cc of shoulder pain. 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 Pt has had severe constant pain since the fall, and she hurts with any range of motion of the neck and shoulder. Pt has no numbness, tingling.  Past Medical History:  Diagnosis Date  . Medical history non-contributory     Patient Active Problem List   Diagnosis Date Noted  . Appendicitis 01/26/2016  . Right ankle sprain 02/09/2012  . Strain of hip and thigh 12/28/2011  . Hamstring muscle strain 08/04/2011  . CLOS FRACTURE MID/PROXIMAL PHALANX/PHALANG HAND 03/24/2010  . SPRAIN AND STRAIN OF INTERPHALANGEAL OF HAND 03/24/2010  . OTHER CLOSED FRACTURES OF DISTAL END OF RADIUS 10/30/2009  . CONTUSION OF WRIST 10/30/2009  . CONTUSION OF KNEE 01/06/2009  . SPRAIN/STRAIN, ANKLE NOS 01/26/2007  . PAIN IN JOINT, ANKLE/FOOT 01/19/2007    Past Surgical History:  Procedure Laterality Date  . LAPAROSCOPIC APPENDECTOMY N/A 01/26/2016   Procedure: APPENDECTOMY LAPAROSCOPIC;  Surgeon: Ancil Linsey, MD;  Location: [facility];  Service: General;  Laterality: N/A;  . NO PAST SURGERIES      OB History    No data available       Home Medications    Prior to Admission medications   Medication Sig Start Date End Date Taking? Authorizing Provider  acetaminophen (TYLENOL) 325 MG tablet Take 2 tablets (650 mg total) by mouth every 6 (six) hours as needed. 01/01/17   Derwood Kaplan, MD  etonogestrel (NEXPLANON) 68 MG IMPL implant 1 each by Subdermal route once.    [provider]  HYDROcodone-acetaminophen (NORCO/VICODIN) 5-325 MG tablet Take 1 tablet by  mouth every 12 (twelve) hours as needed for severe pain. 01/01/17   Derwood Kaplan, MD  ibuprofen (ADVIL,MOTRIN) 600 MG tablet Take 1 tablet (600 mg total) by mouth 4 (four) times daily. 01/01/17   Derwood Kaplan, MD  oxyCODONE-acetaminophen (ROXICET) 5-325 MG tablet Take 1-2 tablets by mouth every 4 (four) hours as needed for severe pain. 01/26/16   Ancil Linsey, MD    Family History No family history on file.  Social History Social History  Substance Use Topics  . Smoking status: Never Smoker  . Smokeless tobacco: Never Used  . Alcohol use 0.0 oz/week     Comment: Occ     Allergies   Patient has no known allergies.   Review of Systems Review of Systems  Constitutional: Positive for activity change.  Musculoskeletal: Positive for arthralgias and myalgias.  Neurological: Negative for numbness and headaches.     Physical Exam Updated Vital Signs BP 121/77   Pulse 81   Temp 98.9 F (37.2 C) (Oral)   Resp 16   Ht  (1.727 m)   Wt 59.9 kg (132 lb)   LMP 12/11/2016   SpO2 100%   BMI 20.07 kg/m   Physical Exam  Constitutional: She is oriented to person, place, and time. She appears well-developed.  HENT:  Head: Normocephalic and atraumatic.  Eyes: EOM are normal.  Neck: Normal range of motion. Neck supple.  No midline c-spine  tenderness, pt able to turn head to 45 degrees bilaterally without any pain and able to flex neck to the chest and extend without any pain or neurologic symptoms.   Cardiovascular: Normal rate.   Pulmonary/Chest: Effort normal.  Abdominal: Bowel sounds are normal.  Musculoskeletal:  Pt has tenderness over the distal 1/2 clavicle, particularly the distal 1/3rd clavicle. Pt has no deformity or tenting. There is ecchymoses over the top of the shoulder and around the [facility] joint  Neurological: She is alert and oriented to person, place, and time.  Skin: Skin is warm and dry.  Nursing note and vitals reviewed.    ED Treatments /  Results  Labs (all labs ordered are listed, but only abnormal results are displayed) Labs Reviewed - No data to display  EKG  EKG Interpretation None       Radiology Dg Cervical Spine Complete  Result Date: 01/01/2017 CLINICAL DATA:  Fall.  Generalized neck pain. EXAM: CERVICAL SPINE - COMPLETE 4+ VIEW COMPARISON:  None. FINDINGS: On the lateral view the cervical spine is visualized to the level of C7-T1. There is a normal cervical lordosis. Pre-vertebral soft tissues are within normal limits. No fracture is detected in the cervical spine. Dens is well positioned between the lateral masses of C1. Cervical disc heights are preserved, with no appreciable spondylosis. No cervical spine subluxation. No significant facet arthropathy. No appreciable bony foraminal stenosis. No aggressive-appearing focal osseous lesions. IMPRESSION: No cervical spine fracture or subluxation . Electronically Signed   By: Delbert Phenix M.D.   On: 01/01/2017 17:28   Dg Clavicle Right  Result Date: 01/01/2017 CLINICAL DATA:  Fall.  Right shoulder/clavicle pain. EXAM: RIGHT CLAVICLE - 2+ VIEWS COMPARISON:  06/12/2011 AC joint views. FINDINGS: There is no evidence of fracture or other focal bone lesions. Soft tissues are unremarkable. IMPRESSION: Negative. Electronically Signed   By: Delbert Phenix M.D.   On: 01/01/2017 17:23   Dg Shoulder Right  Result Date: 01/01/2017 CLINICAL DATA:  Fall.  Right shoulder pain. EXAM: RIGHT SHOULDER - 2+ VIEW COMPARISON:  None. FINDINGS: There is no evidence of fracture or dislocation. There is no evidence of arthropathy or other focal bone abnormality. Soft tissues are unremarkable. IMPRESSION: Negative. Electronically Signed   By: Delbert Phenix M.D.   On: 01/01/2017 17:22    Procedures Procedures (including critical care time)  Medications Ordered in ED Medications  oxyCODONE-acetaminophen (PERCOCET/ROXICET) 5-325 MG per tablet 1 tablet (not administered)  naproxen (NAPROSYN)  tablet 500 mg (not administered)  HYDROcodone-acetaminophen (NORCO/VICODIN) 5-325 MG per tablet (1 tablet  Given 01/01/17 1617)     Initial Impression / Assessment and Plan / ED Course  I have reviewed the triage vital signs and the nursing notes.  Pertinent labs & imaging results that were available during my care of the patient were reviewed by me and considered in my medical decision making (see chart for details).     Pt comes in after a fall onto her shoulder while playing soccer. She has no fractures on the xrays.  With the mechanism of action, I am concerned about possible joint injury or ligamentous injury. mose likely she has severe contusion.  RICE advised and appropriate f/u given.  Final Clinical Impressions(s) / ED Diagnoses   Final diagnoses:  Acute pain of right shoulder  Contusion of right shoulder, initial encounter    New Prescriptions New Prescriptions   ACETAMINOPHEN (TYLENOL) 325 MG TABLET    Take 2 tablets (650 mg total) by mouth every  6 (six) hours as needed.   HYDROCODONE-ACETAMINOPHEN (NORCO/VICODIN) 5-325 MG TABLET    Take 1 tablet by mouth every 12 (twelve) hours as needed for severe pain.   IBUPROFEN (ADVIL,MOTRIN) 600 MG TABLET    Take 1 tablet (600 mg total) by mouth 4 (four) times daily.     Derwood Kaplan, MD 01/01/17 [phone]

## 2017-01-01 NOTE — Discharge Instructions (Signed)
Rest and elevate the affected painful area.  Apply cold compresses 4-6 times a day for 10 min.  As pain recedes, begin normal activities slowly as tolerated.   See the sports doctor or the orthopedic doctor in 5 days.

## 2017-01-01 NOTE — ED Notes (Signed)
Pt understood dc material. NAD Noted 

## 2017-01-03 ENCOUNTER — Other Ambulatory Visit (HOSPITAL_COMMUNITY): Payer: Self-pay | Admitting: Orthopedic Surgery

## 2017-01-03 DIAGNOSIS — M25511 Pain in right shoulder: Secondary | ICD-10-CM

## 2017-01-03 DIAGNOSIS — S4991XA Unspecified injury of right shoulder and upper arm, initial encounter: Secondary | ICD-10-CM

## 2017-01-05 DIAGNOSIS — M25511 Pain in right shoulder: Secondary | ICD-10-CM | POA: Diagnosis not present

## 2017-01-05 MED FILL — HYDROCODON-APAP 5-325: 5-325 | 7 days supply | Qty: 30 | Fill #0

## 2017-01-10 DIAGNOSIS — S4351XA Sprain of right acromioclavicular joint, initial encounter: Secondary | ICD-10-CM | POA: Diagnosis not present

## 2017-01-24 ENCOUNTER — Other Ambulatory Visit (HOSPITAL_COMMUNITY): Payer: 59

## 2017-01-24 ENCOUNTER — Ambulatory Visit (HOSPITAL_COMMUNITY): Payer: 59

## 2017-01-24 DIAGNOSIS — S4351XD Sprain of right acromioclavicular joint, subsequent encounter: Secondary | ICD-10-CM | POA: Diagnosis not present

## 2017-02-21 DIAGNOSIS — S4351XD Sprain of right acromioclavicular joint, subsequent encounter: Secondary | ICD-10-CM | POA: Diagnosis not present

## 2017-08-11 ENCOUNTER — Ambulatory Visit (INDEPENDENT_AMBULATORY_CARE_PROVIDER_SITE_OTHER): Payer: 59 | Admitting: Physician Assistant

## 2017-08-11 ENCOUNTER — Encounter: Payer: Self-pay | Admitting: Physician Assistant

## 2017-08-11 ENCOUNTER — Other Ambulatory Visit: Payer: Self-pay

## 2017-08-11 ENCOUNTER — Ambulatory Visit
Admission: RE | Admit: 2017-08-11 | Discharge: 2017-08-11 | Disposition: A | Discharge status: Home / Self Care | Payer: 59 | Source: Ambulatory Visit | Attending: Physician Assistant | Admitting: Physician Assistant

## 2017-08-11 ENCOUNTER — Ambulatory Visit: Payer: 59

## 2017-08-11 VITALS — BP 106/78 | HR 83 | Temp 99.0°F | Resp 16 | Ht 68.0 in | Wt 141.6 lb

## 2017-08-11 DIAGNOSIS — Z124 Encounter for screening for malignant neoplasm of cervix: Secondary | ICD-10-CM | POA: Diagnosis not present

## 2017-08-11 DIAGNOSIS — Z975 Presence of (intrauterine) contraceptive device: Secondary | ICD-10-CM | POA: Insufficient documentation

## 2017-08-11 DIAGNOSIS — Z130 Encounter for screening for diseases of the blood and blood-forming organs and certain disorders involving the immune mechanism: Secondary | ICD-10-CM | POA: Diagnosis not present

## 2017-08-11 DIAGNOSIS — Z118 Encounter for screening for other infectious and parasitic diseases: Secondary | ICD-10-CM | POA: Diagnosis not present

## 2017-08-11 DIAGNOSIS — Z23 Encounter for immunization: Secondary | ICD-10-CM

## 2017-08-11 DIAGNOSIS — Z111 Encounter for screening for respiratory tuberculosis: Secondary | ICD-10-CM | POA: Diagnosis not present

## 2017-08-11 DIAGNOSIS — R87612 Low grade squamous intraepithelial lesion on cytologic smear of cervix (LGSIL): Secondary | ICD-10-CM | POA: Diagnosis not present

## 2017-08-11 DIAGNOSIS — Z1329 Encounter for screening for other suspected endocrine disorder: Secondary | ICD-10-CM

## 2017-08-11 DIAGNOSIS — Z114 Encounter for screening for human immunodeficiency virus [HIV]: Secondary | ICD-10-CM

## 2017-08-11 DIAGNOSIS — Z13228 Encounter for screening for other metabolic disorders: Secondary | ICD-10-CM

## 2017-08-11 DIAGNOSIS — Z0000 Encounter for general adult medical examination without abnormal findings: Secondary | ICD-10-CM
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
## 2017-08-11 NOTE — Patient Instructions (Addendum)
Please go to [place] at [facility] for the chest xray. [address] Angela Blake is helping to coordinate getting Dr. Juel Blake signature on your form, and they should be expecting you there this morning. Give them your form, and make sure they make a copy, and give you the original, once it's signed.  Have a WONDERFUL time in [place] and [place]! When you get back, I will be at [place], [place], [place] [address]  If you choose to stay at [place] [facility] [place], I recommend Angela Delaine, PA-C or Dr. Pamella Blake.    IF you received an x-ray today, you will receive an invoice from [facility]. Please contact [facility] at [phone] with questions or concerns regarding your invoice.   IF you received labwork today, you will receive an invoice from [facility]. Please contact [facility] at [phone] with questions or concerns regarding your invoice.   Our billing staff will not be able to assist you with questions regarding bills from these companies.  You will be contacted with the lab results as soon as they are available. The fastest way to get your results is to activate your My Chart account. Instructions are located on the last page of this paperwork. If you have not heard from [place] regarding the results in 2 weeks, please contact this office.      Preventive Care 18-39 Years, Female Preventive care refers to lifestyle choices and visits with your health care provider that can promote health and wellness. What does preventive care include?  A yearly physical exam. This is also called an annual well check.  Dental exams once or twice a year.  Routine eye exams. Ask your health care provider how often you should have your eyes checked.  Personal lifestyle choices, including: ? Daily care of your teeth and gums. ? Regular physical activity. ? Eating a healthy diet. ? Avoiding  tobacco and drug use. ? Limiting alcohol use. ? Practicing safe sex. ? Taking vitamin and mineral supplements as recommended by your health care provider. What happens during an annual well check? The services and screenings done by your health care provider during your annual well check will depend on your age, overall health, lifestyle risk factors, and family history of disease. Counseling Your health care provider may ask you questions about your:  Alcohol use.  Tobacco use.  Drug use.  Emotional well-being.  Home and relationship well-being.  Sexual activity.  Eating habits.  Work and work Statistician.  Method of birth control.  Menstrual cycle.  Pregnancy history.  Screening You may have the following tests or measurements:  Height, weight, and BMI.  Diabetes screening. This is done by checking your blood sugar (glucose) after you have not eaten for a while (fasting).  Blood pressure.  Lipid and cholesterol levels. These may be checked every 5 years starting at age 40.  Skin check.  Hepatitis C blood test.  Hepatitis B blood test.  Sexually transmitted disease (STD) testing.  BRCA-related cancer screening. This may be done if you have a family history of breast, ovarian, tubal, or peritoneal cancers.  Pelvic exam and Pap test. This may be done every 3 years starting at age 40. Starting at age 60, this may be done every 5 years if you have a Pap test in combination with an HPV test.  Discuss your test results, treatment options, and if necessary, the need for more tests with your  health care provider. Vaccines Your health care provider may recommend certain vaccines, such as:  Influenza vaccine. This is recommended every year.  Tetanus, diphtheria, and acellular pertussis (Tdap, Td) vaccine. You may need a Td booster every 10 years.  Varicella vaccine. You may need this if you have not been vaccinated.  HPV vaccine. If you are 74 or younger, you  may need three doses over 6 months.  Measles, mumps, and rubella (MMR) vaccine. You may need at least one dose of MMR. You may also need a second dose.  Pneumococcal 13-valent conjugate (PCV13) vaccine. You may need this if you have certain conditions and were not previously vaccinated.  Pneumococcal polysaccharide (PPSV23) vaccine. You may need one or two doses if you smoke cigarettes or if you have certain conditions.  Meningococcal vaccine. One dose is recommended if you are age 7-21 years and a first-year college student living in a residence hall, or if you have one of several medical conditions. You may also need additional booster doses.  Hepatitis A vaccine. You may need this if you have certain conditions or if you travel or work in places where you may be exposed to hepatitis A.  Hepatitis B vaccine. You may need this if you have certain conditions or if you travel or work in places where you may be exposed to hepatitis B.  Haemophilus influenzae type b (Hib) vaccine. You may need this if you have certain risk factors.  Talk to your health care provider about which screenings and vaccines you need and how often you need them. This information is not intended to replace advice given to you by your health care provider. Make sure you discuss any questions you have with your health care provider. Document Released: 05/18/2001 Document Revised: 12/10/2015 Document Reviewed: 01/21/2015 Elsevier Interactive Patient Education  [employer].

## 2017-08-12 LAB — URINALYSIS, DIPSTICK ONLY
Bilirubin, UA: NEGATIVE
GLUCOSE, UA: NEGATIVE
Nitrite, UA: NEGATIVE
Protein, UA: NEGATIVE
RBC UA: NEGATIVE
Specific Gravity, UA: 1.026 (ref 1.005–1.030)
Urobilinogen, Ur: 0.2 mg/dL (ref 0.2–1.0)
pH, UA: 5.5 (ref 5.0–7.5)

## 2017-08-12 LAB — CBC WITH DIFFERENTIAL/PLATELET
BASOS ABS: 0 10*3/uL (ref 0.0–0.2)
Basos: 1 %
EOS (ABSOLUTE): 0.1 10*3/uL (ref 0.0–0.4)
EOS: 1 %
Hematocrit: 37.5 % (ref 34.0–46.6)
Hemoglobin: 12.8 g/dL (ref 11.1–15.9)
IMMATURE GRANS (ABS): 0 10*3/uL (ref 0.0–0.1)
Immature Granulocytes: 0 %
Lymphocytes Absolute: 1.4 10*3/uL (ref 0.7–3.1)
Lymphs: 24 %
MCH: 29.6 pg (ref 26.6–33.0)
MCHC: 34.1 g/dL (ref 31.5–35.7)
MCV: 87 fL (ref 79–97)
MONOCYTES: 7 %
Monocytes Absolute: 0.4 10*3/uL (ref 0.1–0.9)
NEUTROS ABS: 3.9 10*3/uL (ref 1.4–7.0)
Neutrophils: 67 %
Platelets: 264 10*3/uL (ref 150–379)
RBC: 4.33 x10E6/uL (ref 3.77–5.28)
RDW: 13.8 % (ref 12.3–15.4)
WBC: 5.8 10*3/uL (ref 3.4–10.8)

## 2017-08-12 LAB — COMPREHENSIVE METABOLIC PANEL
ALBUMIN: 5 g/dL (ref 3.5–5.5)
ALK PHOS: 69 IU/L (ref 39–117)
ALT: 15 IU/L (ref 0–32)
AST: 19 IU/L (ref 0–40)
Albumin/Globulin Ratio: 2 (ref 1.2–2.2)
BUN/Creatinine Ratio: 11 (ref 9–23)
BUN: 12 mg/dL (ref 6–20)
Bilirubin Total: 1.8 mg/dL — ABNORMAL HIGH (ref 0.0–1.2)
CO2: 23 mmol/L (ref 20–29)
Calcium: 9.7 mg/dL (ref 8.7–10.2)
Chloride: 104 mmol/L (ref 96–106)
Creatinine, Ser: 1.05 mg/dL — ABNORMAL HIGH (ref 0.57–1.00)
GFR calc Af Amer: 88 mL/min/{1.73_m2} (ref >59)
GFR calc non Af Amer: 76 mL/min/{1.73_m2} (ref >59)
GLUCOSE: 88 mg/dL (ref 65–99)
Globulin, Total: 2.5 g/dL (ref 1.5–4.5)
Potassium: 4.3 mmol/L (ref 3.5–5.2)
Sodium: 143 mmol/L (ref 134–144)
Total Protein: 7.5 g/dL (ref 6.0–8.5)

## 2017-08-12 LAB — LIPID PANEL
CHOLESTEROL TOTAL: 111 mg/dL (ref 100–199)
Chol/HDL Ratio: 1.9 ratio (ref 0.0–4.4)
HDL: 60 mg/dL (ref >39)
LDL CALC: 39 mg/dL (ref 0–99)
TRIGLYCERIDES: 60 mg/dL (ref 0–149)
VLDL CHOLESTEROL CAL: 12 mg/dL (ref 5–40)

## 2017-08-12 LAB — TSH: TSH: 0.502 u[IU]/mL (ref 0.450–4.500)

## 2017-08-12 LAB — HIV ANTIBODY (ROUTINE TESTING W REFLEX): HIV SCREEN 4TH GENERATION: NONREACTIVE

## 2017-08-16 LAB — PAP IG, CT-NG, RFX HPV ASCU
Chlamydia, Nuc. Acid Amp: NEGATIVE
Gonococcus by Nucleic Acid Amp: NEGATIVE
PAP SMEAR COMMENT: 0

## 2017-09-05 ENCOUNTER — Encounter: Payer: Self-pay | Admitting: *Deleted

## 2017-09-13 DIAGNOSIS — H5213 Myopia, bilateral: Secondary | ICD-10-CM | POA: Diagnosis not present

## 2018-05-06 ENCOUNTER — Ambulatory Visit
Admission: EM | Admit: 2018-05-06 | Discharge: 2018-05-06 | Disposition: A | Discharge status: Home / Self Care | Payer: Commercial Managed Care - PPO | Attending: Family Medicine | Admitting: Family Medicine

## 2018-05-06 ENCOUNTER — Ambulatory Visit: Payer: Commercial Managed Care - PPO

## 2018-05-06 ENCOUNTER — Other Ambulatory Visit: Payer: Self-pay

## 2018-05-06 DIAGNOSIS — M25511 Pain in right shoulder: Secondary | ICD-10-CM | POA: Diagnosis not present

## 2018-05-06 DIAGNOSIS — M25512 Pain in left shoulder: Secondary | ICD-10-CM | POA: Diagnosis not present

## 2018-05-06 MED ORDER — NAPROXEN 500 MG PO TABS: 500.0000 mg | ORAL_TABLET | Freq: Two times a day (BID) | ORAL | 0 refills | Status: DC

## 2018-05-06 NOTE — ED Triage Notes (Signed)
Per pt her left shoulder had started hurting 3 days ago. Pt did go skiing last Saturday, that would be the only falls or accident. Pt stated she can't raise above head or have strength to grip anything.

## 2018-05-06 NOTE — Discharge Instructions (Signed)
X-ray normal Use anti-inflammatories for pain/swelling. You may take up to 800 mg Ibuprofen every 8 hours with food. You may supplement Ibuprofen with Tylenol [phone] mg every 8 hours.  OR Naprosyn twice daily Ice  Follow-up with primary care or orthopedics for further evaluation of your shoulder pain

## 2018-05-06 NOTE — ED Provider Notes (Signed)
[facility]    CSN: [id number] Arrival date & time: 05/06/18  1233     History   Chief Complaint Chief Complaint  Patient presents with  . Shoulder Pain    HPI Angela Blake is a 23 y.o. female no contributing past medical history presenting today for evaluation of left shoulder pain.  Patient states that for the past 2 to 3 days she has had pain in her left shoulder.  She has had pain with lifting and is felt decreased strength and difficulty gripping objects.  She denies any specific injury, but does note that this past weekend she went skiing and did have a few falls, but denies any specific fall that caused her pain or triggered her symptoms.  She took 1 tablet of ibuprofen prior to arrival, but has not consistently taken anything for pain.  She denies any history of issues with this shoulder previously.  Denies numbness or tingling.  Denies pain or difficulty moving elbow or fingers.  Denies neck pain.  HPI  Past Medical History:  Diagnosis Date  . Appendicitis 01/26/2016  . CLOS FRACTURE MID/PROXIMAL PHALANX/PHALANG HAND 03/24/2010   Qualifier: Diagnosis of  By: Romeo AppleHarrison MD, Duffy RhodyStanley    . Hamstring muscle strain 08/04/2011  . Medical history non-contributory   . OTHER CLOSED FRACTURES OF DISTAL END OF RADIUS 10/30/2009   Qualifier: Diagnosis of  By: Romeo AppleHarrison MD, Duffy RhodyStanley    . Right ankle sprain 02/09/2012  . SPRAIN AND STRAIN OF INTERPHALANGEAL OF HAND 03/24/2010   Qualifier: Diagnosis of  By: Romeo AppleHarrison MD, Duffy RhodyStanley    . SPRAIN/STRAIN, ANKLE NOS 01/26/2007   Qualifier: Diagnosis of  By: Romeo AppleHarrison MD, Duffy RhodyStanley      Patient Active Problem List   Diagnosis Date Noted  . Nexplanon in place 08/11/2017    Past Surgical History:  Procedure Laterality Date  . LAPAROSCOPIC APPENDECTOMY N/A 01/26/2016   Procedure: APPENDECTOMY LAPAROSCOPIC;  Surgeon: Ancil LinseyJason Evan Davis, MD;  Location: [facility];  Service: General;  Laterality: N/A;    OB History   No obstetric history on  file.      Home Medications    Prior to Admission medications   Medication Sig Start Date End Date Taking? Authorizing Provider  etonogestrel (NEXPLANON) 68 MG IMPL implant Nexplanon 68 mg subdermal implant  Inject 1 implant by subcutaneous route.    [provider]  naproxen (NAPROSYN) 500 MG tablet Take 1 tablet (500 mg total) by mouth 2 (two) times daily. 05/06/18   Daisa Stennis, Junius CreamerHallie C, PA-C    Family History No family history on file.  Social History Social History   Tobacco Use  . Smoking status: Never Smoker  . Smokeless tobacco: Never Used  Substance Use Topics  . Alcohol use: Yes    Alcohol/week: 0.0 standard drinks    Comment: Occ  . Drug use: No     Allergies   Patient has no known allergies.   Review of Systems Review of Systems  Constitutional: Negative for fatigue and fever.  Eyes: Negative for visual disturbance.  Respiratory: Negative for shortness of breath.   Cardiovascular: Negative for chest pain.  Gastrointestinal: Negative for abdominal pain, nausea and vomiting.  Musculoskeletal: Positive for arthralgias and myalgias. Negative for joint swelling, neck pain and neck stiffness.  Skin: Positive for color change and rash. Negative for wound.  Neurological: Negative for dizziness, weakness, light-headedness, numbness and headaches.     Physical Exam Triage Vital Signs ED Triage Vitals  Enc Vitals Group  BP 05/06/18 1242 127/78     Pulse Rate 05/06/18 1242 75     Resp 05/06/18 1242 16     Temp 05/06/18 1242 98.3 F (36.8 C)     Temp Source 05/06/18 1242 Oral     SpO2 05/06/18 1242 98 %     Weight 05/06/18 1252 145 lb (65.8 kg)     Height 05/06/18 1252 5\' 9"  (1.753 m)     Head Circumference --      Peak Flow --      Pain Score 05/06/18 1252 6     Pain Loc --      Pain Edu? --      Excl. in GC? --    No data found.  Updated Vital Signs BP 127/78 (BP Location: Left Arm)   Pulse 75   Temp 98.3 F (36.8 C) (Oral)   Resp 16    Ht 5\' 9"  (1.753 m)   Wt 145 lb (65.8 kg)   SpO2 98%   BMI 21.41 kg/m   Visual Acuity Right Eye Distance:   Left Eye Distance:   Bilateral Distance:    Right Eye Near:   Left Eye Near:    Bilateral Near:     Physical Exam Vitals signs and nursing note reviewed.  Constitutional:      Appearance: She is well-developed.     Comments: No acute distress  HENT:     Head: Normocephalic and atraumatic.     Nose: Nose normal.  Eyes:     Conjunctiva/sclera: Conjunctivae normal.  Neck:     Musculoskeletal: Neck supple.  Cardiovascular:     Rate and Rhythm: Normal rate.  Pulmonary:     Effort: Pulmonary effort is normal. No respiratory distress.  Abdominal:     General: There is no distension.  Musculoskeletal: Normal range of motion.     Comments: Right shoulder: No obvious deformity or swelling, tenderness palpation of distal clavicle and scapular spine near [facility] joint extending towards proximal bicep  Limited active range of motion past 90 degrees abduction Negative liftoff Does have weakness with resisted external rotation Positive impingement  Skin:    General: Skin is warm and dry.  Neurological:     Mental Status: She is alert and oriented to person, place, and time.      UC Treatments / Results  Labs (all labs ordered are listed, but only abnormal results are displayed) Labs Reviewed - No data to display  EKG None  Radiology Dg Shoulder Left  Result Date: 05/06/2018 CLINICAL DATA:  Acute left shoulder pain after possible ski injury. EXAM: LEFT SHOULDER - 2+ VIEW COMPARISON:  None. FINDINGS: There is no evidence of fracture or dislocation. There is no evidence of arthropathy or other focal bone abnormality. Soft tissues are unremarkable. IMPRESSION: Negative. Electronically Signed   By: Lupita Raider, M.D.   On: 05/06/2018 13:48    Procedures Procedures (including critical care time)  Medications Ordered in UC Medications - No data to display  Initial  Impression / Assessment and Plan / UC Course  I have reviewed the triage vital signs and the nursing notes.  Pertinent labs & imaging results that were available during my care of the patient were reviewed by me and considered in my medical decision making (see chart for details).     X-ray negative, most likely more shoulder strain versus impingement versus rotator cuff tendinopathy.  Will recommend anti-inflammatories, passive range of motion exercises ice, follow-up with PCP/orthopedics for  further imaging if symptoms persisting without any improvement as she did have decreased strength with resisted external rotation.Discussed strict return precautions. Patient verbalized understanding and is agreeable with plan.  Final Clinical Impressions(s) / UC Diagnoses   Final diagnoses:  Acute pain of right shoulder     Discharge Instructions     X-ray normal Use anti-inflammatories for pain/swelling. You may take up to 800 mg Ibuprofen every 8 hours with food. You may supplement Ibuprofen with Tylenol [phone] mg every 8 hours.  OR Naprosyn twice daily Ice  Follow-up with primary care or orthopedics for further evaluation of your shoulder pain    ED Prescriptions    Medication Sig Dispense Auth. Provider   naproxen (NAPROSYN) 500 MG tablet Take 1 tablet (500 mg total) by mouth 2 (two) times daily. 30 tablet Eretria Manternach, Lone RockHallie C, PA-C     Controlled Substance Prescriptions [place] Controlled Substance Registry consulted? Not Applicable   Lew DawesWieters, Myshawn Chiriboga C, [place] JerseyPA-C 05/06/18 1500

## 2018-05-23 ENCOUNTER — Encounter: Payer: Self-pay | Admitting: Nurse Practitioner

## 2018-05-23 ENCOUNTER — Ambulatory Visit: Payer: Self-pay | Admitting: Nurse Practitioner

## 2018-05-23 VITALS — BP 110/72 | HR 84 | Temp 97.6°F | Resp 12 | Wt 163.8 lb

## 2018-05-23 DIAGNOSIS — J019 Acute sinusitis, unspecified: Secondary | ICD-10-CM

## 2018-05-23 DIAGNOSIS — B9789 Other viral agents as the cause of diseases classified elsewhere: Secondary | ICD-10-CM

## 2018-05-23 MED ORDER — FLUTICASONE PROPIONATE 50 MCG/ACT NA SUSP: 2.0000 | Freq: Every day | NASAL | 0 refills | Status: DC

## 2018-05-23 MED ORDER — CETIRIZINE HCL 10 MG PO TABS: 10.0000 mg | ORAL_TABLET | Freq: Every day | ORAL | 0 refills | Status: DC

## 2018-05-23 MED ORDER — PSEUDOEPH-BROMPHEN-DM 30-2-10 MG/5ML PO SYRP
5.0000 mL | ORAL_SOLUTION | Freq: Four times a day (QID) | ORAL | 0 refills | Status: AC | PRN
Start: 2018-05-23 — End: 2018-05-30

## 2018-05-23 NOTE — Patient Instructions (Signed)
Sinusitis, Adult -Take medication as prescribed. -Ibuprofen or Tylenol for pain, fever, or general discomfort. -Increase fluids. -Get plenty of rest. -Sleep elevated on at least 2 pillows at bedtime to help with cough if needed. -Use a humidifier or vaporizer when at home and during sleep. -May use a teaspoon of honey or over-the-counter cough drops to help with cough and sore throat. -May use normal saline nasal spray to help with nasal congestion throughout the day. -Follow-up if symptoms do not improve. -Return to class on May 26, 2018.   Sinusitis is inflammation of your sinuses. Sinuses are hollow spaces in the bones around your face. Your sinuses are located:  Around your eyes.  In the middle of your forehead.  Behind your nose.  In your cheekbones. Mucus normally drains out of your sinuses. When your nasal tissues become inflamed or swollen, mucus can become trapped or blocked. This allows bacteria, viruses, and fungi to grow, which leads to infection. Most infections of the sinuses are caused by a virus. Sinusitis can develop quickly. It can last for up to 4 weeks (acute) or for more than 12 weeks (chronic). Sinusitis often develops after a cold. What are the causes? This condition is caused by anything that creates swelling in the sinuses or stops mucus from draining. This includes:  Allergies.  Asthma.  Infection from bacteria or viruses.  Deformities or blockages in your nose or sinuses.  Abnormal growths in the nose (nasal polyps).  Pollutants, such as chemicals or irritants in the air.  Infection from fungi (rare). What increases the risk? You are more likely to develop this condition if you:  Have a weak body defense system (immune system).  Do a lot of swimming or diving.  Overuse nasal sprays.  Smoke. What are the signs or symptoms? The main symptoms of this condition are pain and a feeling of pressure around the affected sinuses. Other symptoms  include:  Stuffy nose or congestion.  Thick drainage from your nose.  Swelling and warmth over the affected sinuses.  Headache.  Upper toothache.  A cough that may get worse at night.  Extra mucus that collects in the throat or the back of the nose (postnasal drip).  Decreased sense of smell and taste.  Fatigue.  A fever.  Sore throat.  Bad breath. How is this diagnosed? This condition is diagnosed based on:  Your symptoms.  Your medical history.  A physical exam.  Tests to find out if your condition is acute or chronic. This may include: ? Checking your nose for nasal polyps. ? Viewing your sinuses using a device that has a light (endoscope). ? Testing for allergies or bacteria. ? Imaging tests, such as an MRI or CT scan. In rare cases, a bone biopsy may be done to rule out more serious types of fungal sinus disease. How is this treated? Treatment for sinusitis depends on the cause and whether your condition is chronic or acute.  If caused by a virus, your symptoms should go away on their own within 10 days. You may be given medicines to relieve symptoms. They include: ? Medicines that shrink swollen nasal passages (topical intranasal decongestants). ? Medicines that treat allergies (antihistamines). ? A spray that eases inflammation of the nostrils (topical intranasal corticosteroids). ? Rinses that help get rid of thick mucus in your nose (nasal saline washes).  If caused by bacteria, your health care provider may recommend waiting to see if your symptoms improve. Most bacterial infections will get better  without antibiotic medicine. You may be given antibiotics if you have: ? A severe infection. ? A weak immune system.  If caused by narrow nasal passages or nasal polyps, you may need to have surgery. Follow these instructions at home: Medicines  Take, use, or apply over-the-counter and prescription medicines only as told by your health care provider. These  may include nasal sprays.  If you were prescribed an antibiotic medicine, take it as told by your health care provider. Do not stop taking the antibiotic even if you start to feel better. Hydrate and humidify   Drink enough fluid to keep your urine pale yellow. Staying hydrated will help to thin your mucus.  Use a cool mist humidifier to keep the humidity level in your home above 50%.  Inhale steam for 10-15 minutes, 3-4 times a day, or as told by your health care provider. You can do this in the bathroom while a hot shower is running.  Limit your exposure to cool or dry air. Rest  Rest as much as possible.  Sleep with your head raised (elevated).  Make sure you get enough sleep each night. General instructions   Apply a warm, moist washcloth to your face 3-4 times a day or as told by your health care provider. This will help with discomfort.  Wash your hands often with soap and water to reduce your exposure to germs. If soap and water are not available, use hand sanitizer.  Do not smoke. Avoid being around people who are smoking (secondhand smoke).  Keep all follow-up visits as told by your health care provider. This is important. Contact a health care provider if:  You have a fever.  Your symptoms get worse.  Your symptoms do not improve within 10 days. Get help right away if:  You have a severe headache.  You have persistent vomiting.  You have severe pain or swelling around your face or eyes.  You have vision problems.  You develop confusion.  Your neck is stiff.  You have trouble breathing. Summary  Sinusitis is soreness and inflammation of your sinuses. Sinuses are hollow spaces in the bones around your face.  This condition is caused by nasal tissues that become inflamed or swollen. The swelling traps or blocks the flow of mucus. This allows bacteria, viruses, and fungi to grow, which leads to infection.  If you were prescribed an antibiotic medicine,  take it as told by your health care provider. Do not stop taking the antibiotic even if you start to feel better.  Keep all follow-up visits as told by your health care provider. This is important. This information is not intended to replace advice given to you by your health care provider. Make sure you discuss any questions you have with your health care provider. Document Released: 03/22/2005 Document Revised: 08/22/2017 Document Reviewed: 08/22/2017 Elsevier Interactive Patient Education  2019 [employer].

## 2018-05-23 NOTE — Progress Notes (Signed)
Subjective:  Angela Blake is a 23 y.o. female who presents for evaluation of possible sinusitis.  Symptoms include bilateral ear pressure/pain, fever: suspected fevers but not measured at home, headache described as "fullness", nasal congestion, pain while swallowing, post nasal drip, sinus pressure and sore throat.   Patient denies ear pain, cough, wheezing, shortness of breath, difficulty breathing, abdominal pain, nausea, or vomiting.  Onset of symptoms was 1 day ago, and has been unchanged since that time.  Treatment to date:  ibuprofen and advil, cold and flu medicine.  High risk factors for influenza complications:  none.    The following portions of the patient's history were reviewed and updated as appropriate:  allergies, current medications and past medical history.  Past Medical History:  Diagnosis Date  . Appendicitis 01/26/2016  . CLOS FRACTURE MID/PROXIMAL PHALANX/PHALANG HAND 03/24/2010   Qualifier: Diagnosis of  By: Romeo Apple MD, Duffy Rhody    . Hamstring muscle strain 08/04/2011  . Medical history non-contributory   . OTHER CLOSED FRACTURES OF DISTAL END OF RADIUS 10/30/2009   Qualifier: Diagnosis of  By: Romeo Apple MD, Duffy Rhody    . Right ankle sprain 02/09/2012  . SPRAIN AND STRAIN OF INTERPHALANGEAL OF HAND 03/24/2010   Qualifier: Diagnosis of  By: Romeo Apple MD, Duffy Rhody    . SPRAIN/STRAIN, ANKLE NOS 01/26/2007   Qualifier: Diagnosis of  By: Romeo Apple MD, Duffy Rhody      Constitutional: positive for anorexia, fatigue and fevers, negative for chills, malaise and sweats Eyes: negative Ears, nose, mouth, throat, and face: positive for nasal congestion, sore throat and postnasal drip, negative for ear drainage, earaches and hoarseness Respiratory: negative Cardiovascular: negative Gastrointestinal: positive for decreased appetite, negative for abdominal pain, diarrhea, nausea and vomiting Neurological: positive for headaches, negative for coordination problems, dizziness, paresthesia,  vertigo and weakness Allergic/Immunologic: positive for hay fever Objective:  BP 110/72   Pulse 84   Temp 97.6 F (36.4 C)   Resp 12   Wt 163 lb 12.8 oz (74.3 kg)   SpO2 98%   BMI 24.19 kg/m  Physical Exam Vitals signs reviewed.  Constitutional:      General: She is not in acute distress. HENT:     Head: Normocephalic.     Right Ear: Tympanic membrane, ear canal and external ear normal.     Left Ear: Tympanic membrane, ear canal and external ear normal.     Nose: Mucosal edema, congestion (moderate) and rhinorrhea (clear drainage) present.     Right Turbinates: Enlarged and swollen.     Left Turbinates: Enlarged and swollen.     Right Sinus: Frontal sinus tenderness present. No maxillary sinus tenderness.     Left Sinus: No maxillary sinus tenderness or frontal sinus tenderness.     Mouth/Throat:     Lips: Pink.     Mouth: Mucous membranes are moist.     Pharynx: Uvula midline. Posterior oropharyngeal erythema present. No oropharyngeal exudate.     Tonsils: No tonsillar exudate or tonsillar abscesses. Swelling: 0 on the right. 0 on the left.  Eyes:     Conjunctiva/sclera: Conjunctivae normal.     Pupils: Pupils are equal, round, and reactive to light.  Neck:     Musculoskeletal: Normal range of motion and neck supple.  Cardiovascular:     Rate and Rhythm: Normal rate and regular rhythm.     Pulses: Normal pulses.     Heart sounds: Normal heart sounds.  Pulmonary:     Effort: Pulmonary effort is normal. No respiratory distress.  Breath sounds: Normal breath sounds. No stridor. No wheezing, rhonchi or rales.  Abdominal:     General: Abdomen is flat. Bowel sounds are normal.     Tenderness: There is no abdominal tenderness.  Lymphadenopathy:     Cervical: No cervical adenopathy.  Skin:    General: Skin is warm and dry.  Neurological:     General: No focal deficit present.     Mental Status: She is alert and oriented to person, place, and time.     Cranial Nerves:  No cranial nerve deficit.  Psychiatric:        Mood and Affect: Mood normal.        Thought Content: Thought content normal.     Assessment:  Acute Viral Sinusitis    Plan:   Exam findings, diagnosis etiology and medication use and indications reviewed with patient. Follow- Up and discharge instructions provided. No emergent/urgent issues found on exam.  Based on the patient's clinical presentation, symptoms, physical assessment, patient's findings are congruent with that of viral etiology.  Patient symptoms have only persisted for 1 day, however patient does not display typical influenza symptoms to include high fever, dry cough, malaise, body aches, or other concerns at this time.  The patient also does not display purulent nasal drainage, high fever or double sickening.  Will provide the patient with symptomatic treatment to include Bromfed for cough, fluticasone for nasal congestion, and Zyrtec to see if this will help with her sinusitis symptoms.  Informed patient that she is early on in the development of her illness, and is difficult at this time to determine if this is purely viral.  Instructed the patient that if her symptoms do persist after 10 to 14 days, I would like her to follow-up for further eval and consideration for an antibiotic.  Patient is well-appearing, in no acute distress, vital signs are stable, lungs CTAB.  Patient education was provided. Patient verbalized understanding of information provided and agrees with plan of care (POC), all questions answered. The patient is advised to call or return to clinic if condition does not see an improvement in symptoms, or to seek the care of the closest emergency department if condition worsens with the above plan.   1. Acute viral sinusitis  - fluticasone (FLONASE) 50 MCG/ACT nasal spray; Place 2 sprays into both nostrils daily for 10 days.  Dispense: 16 g; Refill: 0 - cetirizine (ZYRTEC) 10 MG tablet; Take 1 tablet (10 mg total) by  mouth daily for 30 days.  Dispense: 30 tablet; Refill: 0 - brompheniramine-pseudoephedrine-DM 30-2-10 MG/5ML syrup; Take 5 mLs by mouth 4 (four) times daily as needed for up to 7 days.  Dispense: 150 mL; Refill: 0 -Take medication as prescribed. -Ibuprofen or Tylenol for pain, fever, or general discomfort. -Increase fluids. -Get plenty of rest. -Sleep elevated on at least 2 pillows at bedtime to help with cough if needed. -Use a humidifier or vaporizer when at home and during sleep. -May use a teaspoon of honey or over-the-counter cough drops to help with cough and sore throat. -May use normal saline nasal spray to help with nasal congestion throughout the day. -Follow-up if symptoms do not improve. -Return to class on May 26, 2018.

## 2018-05-25 ENCOUNTER — Telehealth: Payer: Self-pay

## 2018-05-25 NOTE — Telephone Encounter (Signed)
Called and lm on pt vm tcb regarding an update on how she is feeling since her visit with [place].

## 2018-08-08 DIAGNOSIS — Z30017 Encounter for initial prescription of implantable subdermal contraceptive: Secondary | ICD-10-CM | POA: Diagnosis not present

## 2018-08-16 DIAGNOSIS — Z30017 Encounter for initial prescription of implantable subdermal contraceptive: Secondary | ICD-10-CM | POA: Diagnosis not present

## 2018-08-16 DIAGNOSIS — Z01419 Encounter for gynecological examination (general) (routine) without abnormal findings: Secondary | ICD-10-CM | POA: Diagnosis not present

## 2018-08-16 DIAGNOSIS — Z130 Encounter for screening for diseases of the blood and blood-forming organs and certain disorders involving the immune mechanism: Secondary | ICD-10-CM | POA: Diagnosis not present

## 2018-08-16 DIAGNOSIS — Z113 Encounter for screening for infections with a predominantly sexual mode of transmission: Secondary | ICD-10-CM | POA: Diagnosis not present

## 2018-12-25 IMAGING — DX DG CERVICAL SPINE COMPLETE 4+V
7 series · 7 of 7 positions shown · non-contrast
Comparison: None.

CLINICAL DATA: Fall.  Generalized neck pain.

EXAM:
CERVICAL SPINE - COMPLETE 4+ VIEW

[w cervical spine lat]
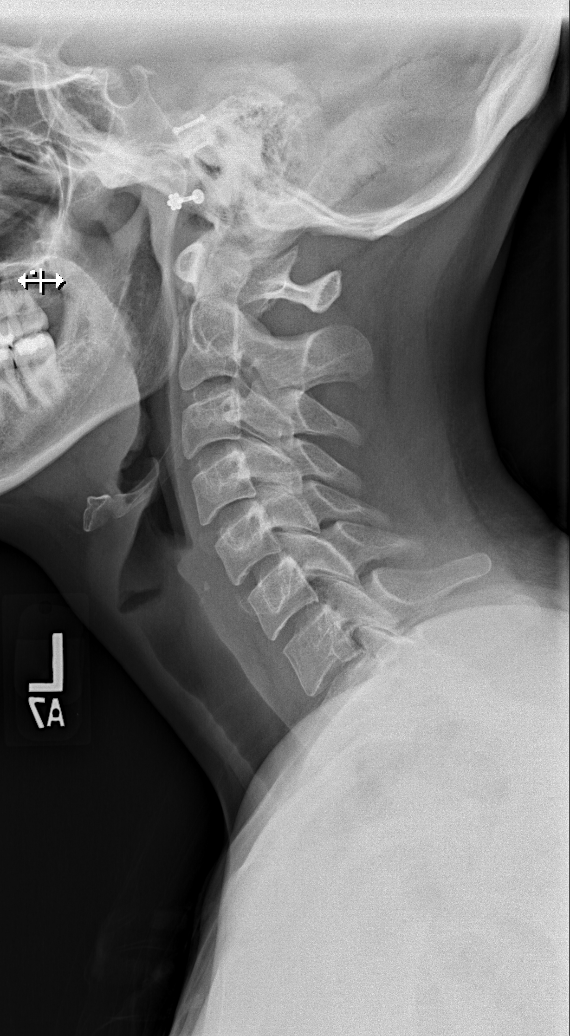

[w cervical spine ap_obl (1 of 2)]
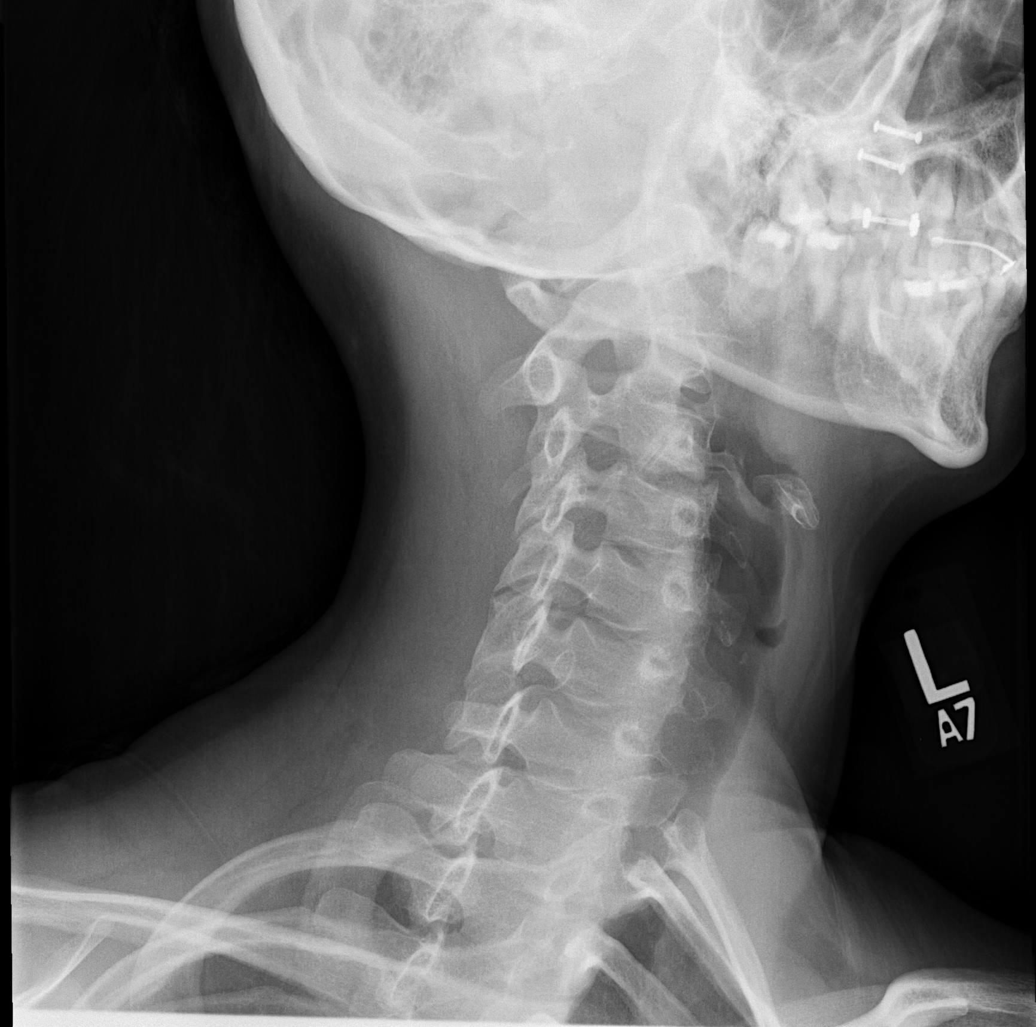

[w cervical spine ap_obl (2 of 2)]
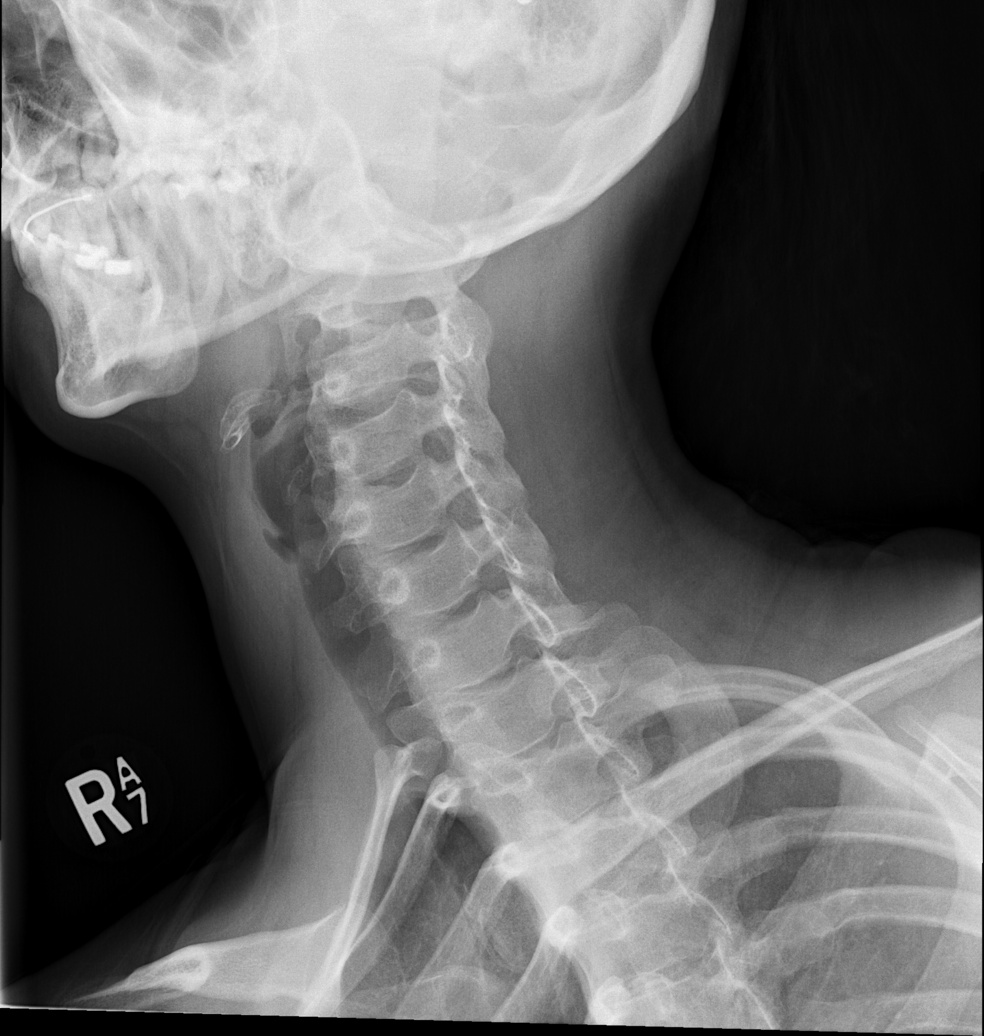

[w cervical spine ap]
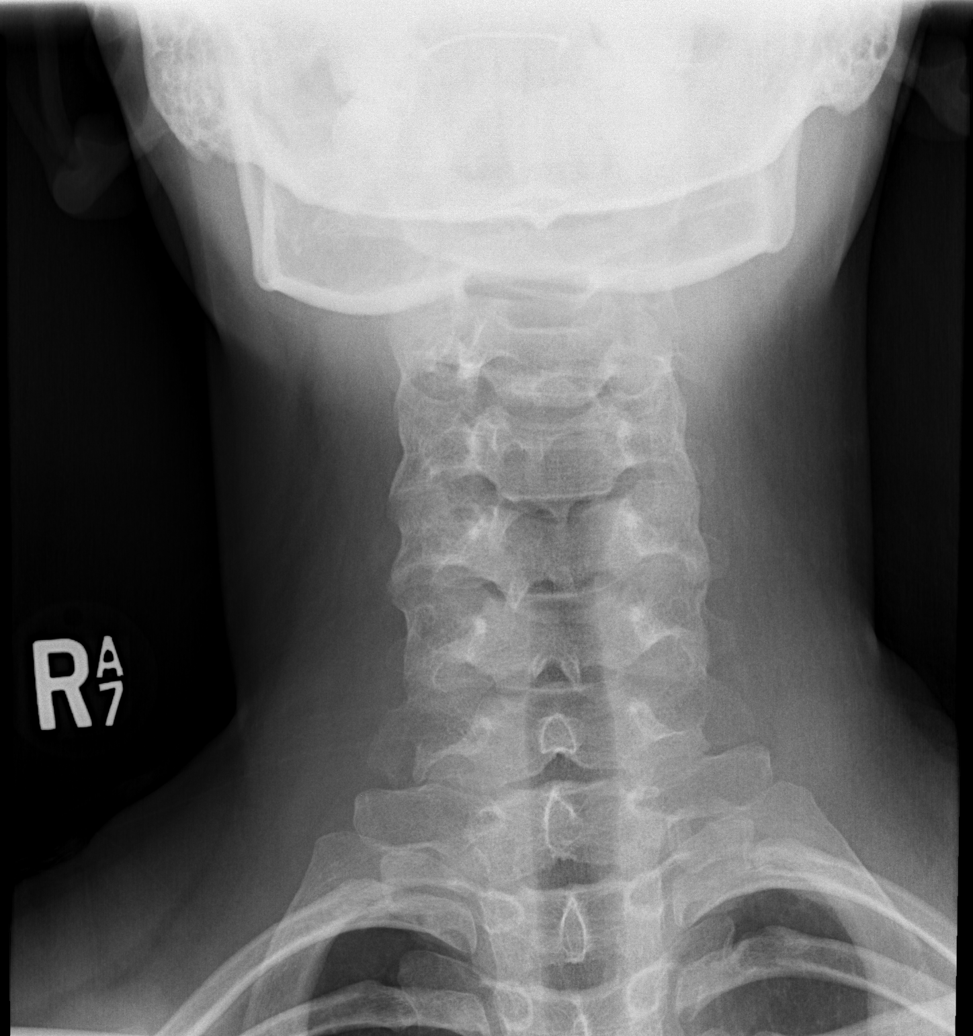

[w cervical spine odontoid (1 of 3)]
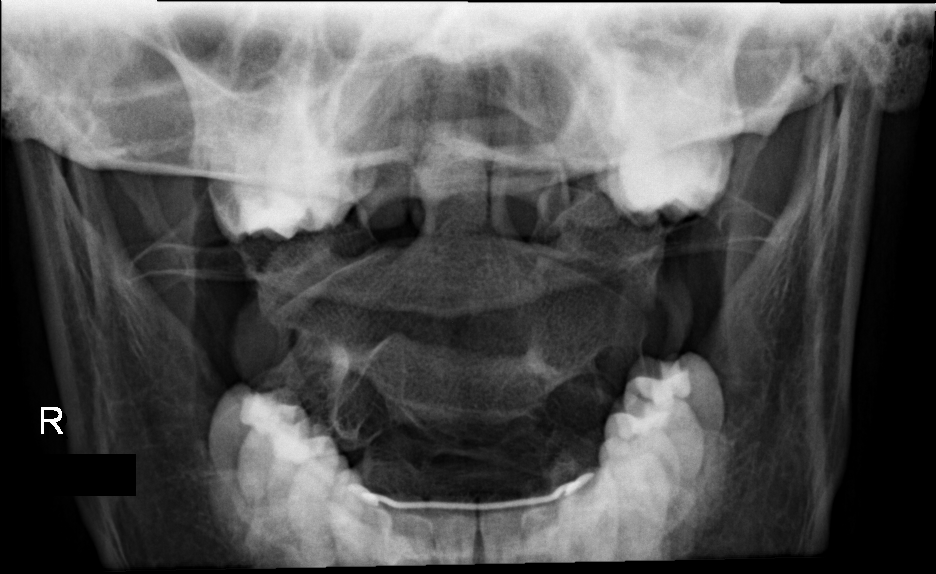

[w cervical spine odontoid (2 of 3)]
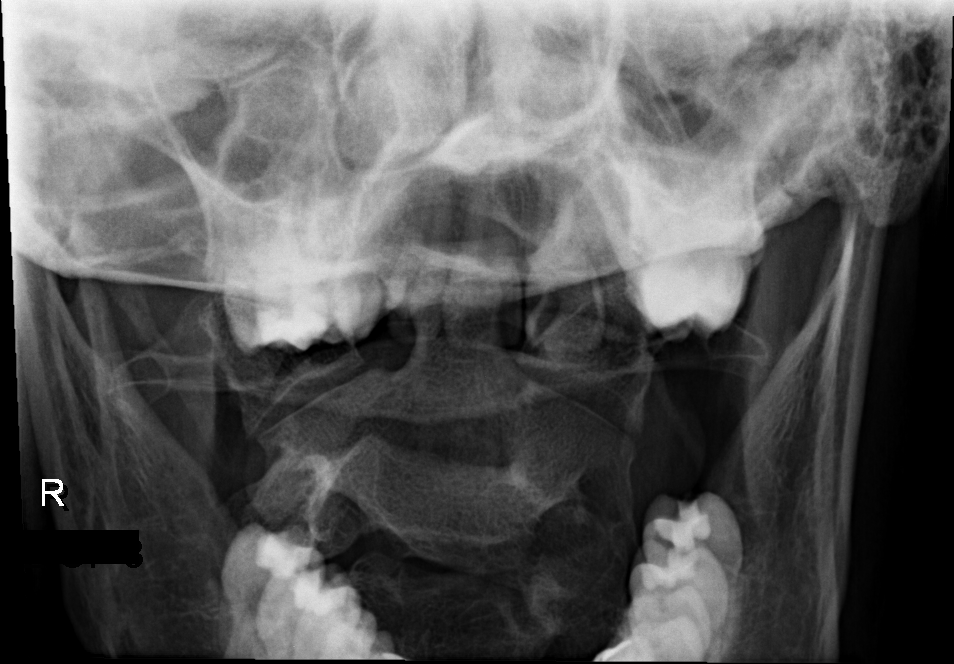

[w cervical spine odontoid (3 of 3)]
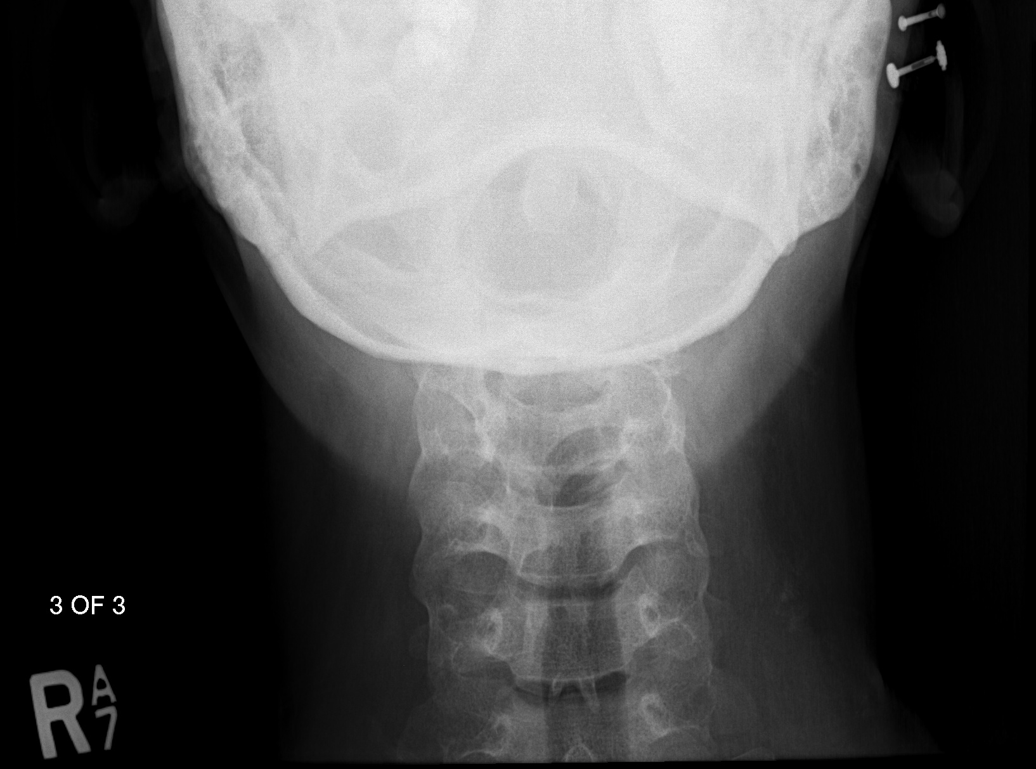

[7 of 7 positions shown; findings below may reference images not displayed]

FINDINGS: On the lateral view the cervical spine is visualized to the level of
C7-T1. There is a normal cervical lordosis. Pre-vertebral soft
tissues are within normal limits. No fracture is detected in the
cervical spine. Dens is well positioned between the lateral masses
of C1. Cervical disc heights are preserved, with no appreciable
spondylosis. No cervical spine subluxation. No significant facet
arthropathy. No appreciable bony foraminal stenosis. No
aggressive-appearing focal osseous lesions.
IMPRESSION: No cervical spine fracture or subluxation .

## 2018-12-25 IMAGING — DX DG CLAVICLE*R*
2 series · 2 of 2 positions shown · non-contrast
Comparison: 06/12/2011 AC joint views.

CLINICAL DATA: Fall.  Right shoulder/clavicle pain.

EXAM:
RIGHT CLAVICLE - 2+ VIEWS

[w clavicle ap right]
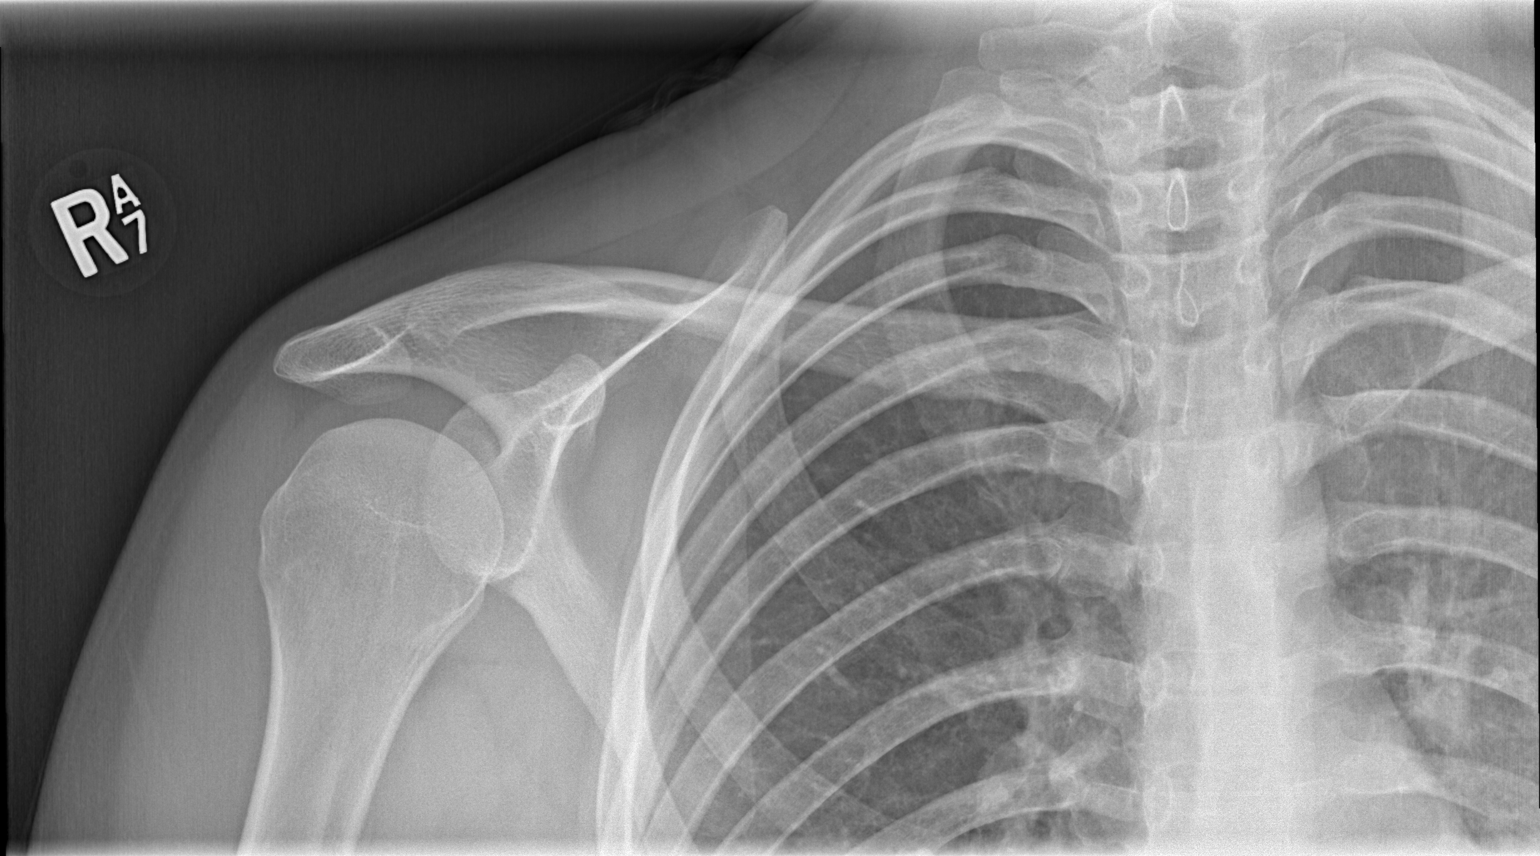

[w clavicle tangential right]
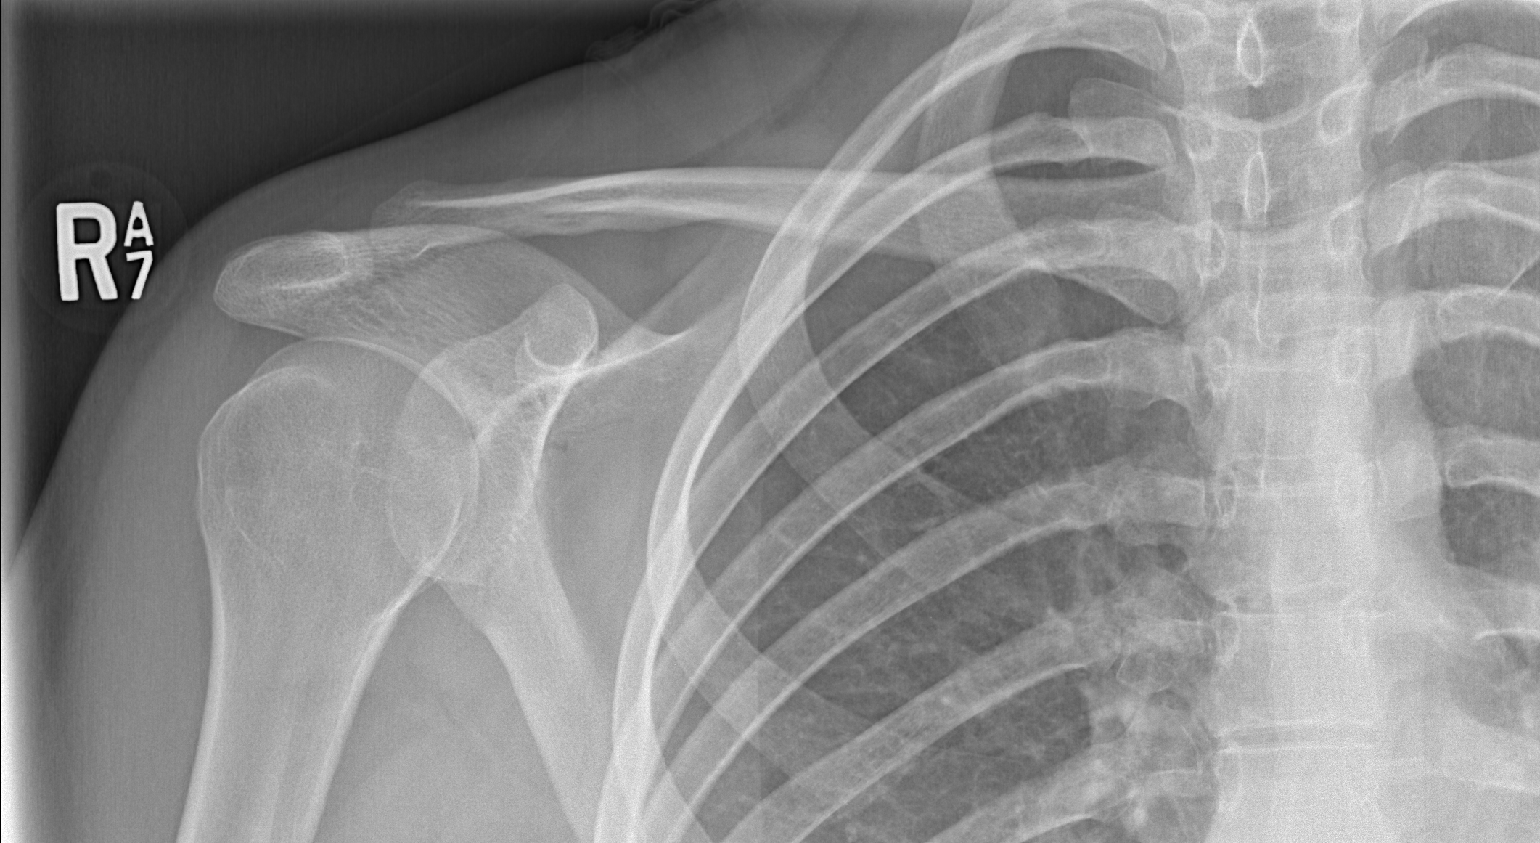

[2 of 2 positions shown; findings below may reference images not displayed]

FINDINGS: There is no evidence of fracture or other focal bone lesions. Soft
tissues are unremarkable.
IMPRESSION: Negative.

## 2019-01-24 MED FILL — FLUARIX QUADRIVALENT 0.5 ML: 0.5 | 1 days supply | Qty: 1 | Fill #0

## 2019-01-31 DIAGNOSIS — H5203 Hypermetropia, bilateral: Secondary | ICD-10-CM | POA: Diagnosis not present

## 2019-07-12 ENCOUNTER — Ambulatory Visit: Payer: Self-pay | Attending: Internal Medicine

## 2019-08-16 ENCOUNTER — Ambulatory Visit: Payer: Self-pay | Attending: Internal Medicine

## 2019-08-16 DIAGNOSIS — Z23 Encounter for immunization: Secondary | ICD-10-CM

## 2019-08-16 NOTE — Progress Notes (Signed)
   Covid-19 Vaccination Clinic  Name:  Angela Blake    MRN: [id number] DOB: 10/10/1995  08/16/2019  Ms. Swing was observed post Covid-19 immunization for 15 minutes without incident. She was provided with Vaccine Information Sheet and instruction to access the V-Safe system.   [name] was instructed to call 911 with any severe reactions post vaccine: Marland Kitchen Difficulty breathing  . Swelling of face and throat  . A fast heartbeat  . A bad rash all over body  . Dizziness and weakness   Immunizations Administered    Name Date Dose VIS Date Route   Pfizer COVID-19 Vaccine 08/16/2019 12:22 PM 0.3 mL 05/30/2018 Intramuscular   Manufacturer: [employer], [employer]   Lot: [id number]   NDC: [address]-8

## 2019-09-10 ENCOUNTER — Ambulatory Visit: Payer: Self-pay | Attending: Internal Medicine

## 2019-09-10 DIAGNOSIS — Z23 Encounter for immunization: Secondary | ICD-10-CM

## 2019-09-10 NOTE — Progress Notes (Signed)
   Covid-19 Vaccination Clinic  Name:  Angela Blake    MRN: [id number] DOB: [birth year]  09/10/2019  [name] was observed post Covid-19 immunization for 15 minutes without incident. She was provided with Vaccine Information Sheet and instruction to access the V-Safe system.   [name] was instructed to call 911 with any severe reactions post vaccine: Marland Kitchen Difficulty breathing  . Swelling of face and throat  . A fast heartbeat  . A bad rash all over body  . Dizziness and weakness   Immunizations Administered    Name Date Dose VIS Date Route   Pfizer COVID-19 Vaccine 09/10/2019  8:29 AM 0.3 mL 05/30/2018 Intramuscular   Manufacturer: [employer], [employer]   Lot: [id number]   NDC: [address]-0

## 2019-11-28 DIAGNOSIS — Z1389 Encounter for screening for other disorder: Secondary | ICD-10-CM | POA: Diagnosis not present

## 2019-11-28 DIAGNOSIS — Z01419 Encounter for gynecological examination (general) (routine) without abnormal findings: Secondary | ICD-10-CM | POA: Diagnosis not present

## 2019-11-28 DIAGNOSIS — Z6823 Body mass index (BMI) 23.0-23.9, adult: Secondary | ICD-10-CM | POA: Diagnosis not present

## 2019-11-28 DIAGNOSIS — Z124 Encounter for screening for malignant neoplasm of cervix: Secondary | ICD-10-CM | POA: Diagnosis not present

## 2019-11-28 DIAGNOSIS — Z113 Encounter for screening for infections with a predominantly sexual mode of transmission: Secondary | ICD-10-CM | POA: Diagnosis not present

## 2019-11-28 DIAGNOSIS — Z1151 Encounter for screening for human papillomavirus (HPV): Secondary | ICD-10-CM | POA: Diagnosis not present

## 2020-02-04 DIAGNOSIS — H5201 Hypermetropia, right eye: Secondary | ICD-10-CM | POA: Diagnosis not present

## 2020-10-07 ENCOUNTER — Emergency Department (HOSPITAL_BASED_OUTPATIENT_CLINIC_OR_DEPARTMENT_OTHER)
Admission: EM | Admit: 2020-10-07 | Discharge: 2020-10-07 | Disposition: A | Discharge status: Home / Self Care | Payer: 59 | Attending: Emergency Medicine | Admitting: Emergency Medicine

## 2020-10-07 ENCOUNTER — Other Ambulatory Visit: Payer: Self-pay

## 2020-10-07 ENCOUNTER — Other Ambulatory Visit (HOSPITAL_COMMUNITY): Payer: Self-pay

## 2020-10-07 ENCOUNTER — Encounter (HOSPITAL_BASED_OUTPATIENT_CLINIC_OR_DEPARTMENT_OTHER): Payer: Self-pay | Admitting: Emergency Medicine

## 2020-10-07 DIAGNOSIS — M5442 Lumbago with sciatica, left side: Secondary | ICD-10-CM | POA: Insufficient documentation

## 2020-10-07 MED ORDER — KETOROLAC TROMETHAMINE 30 MG/ML IJ SOLN
60.0000 mg | Freq: Once | INTRAMUSCULAR | Status: AC
  Administered 2020-10-07: 60 mg via INTRAMUSCULAR
  Filled 2020-10-07: qty 2

## 2020-10-07 MED ORDER — METHOCARBAMOL 500 MG PO TABS
500.0000 mg | ORAL_TABLET | Freq: Two times a day (BID) | ORAL | 0 refills | Status: DC
Start: 2020-10-07 — End: 2020-11-18
  Filled 2020-10-07: qty 20, 10d supply, fill #0

## 2020-10-07 MED ORDER — NAPROXEN 500 MG PO TABS
500.0000 mg | ORAL_TABLET | Freq: Two times a day (BID) | ORAL | 0 refills | Status: DC
  Filled 2020-10-07: qty 14, 7d supply, fill #0

## 2020-10-07 NOTE — ED Triage Notes (Signed)
Pt complains of back pain that shoots up her spine and down the left leg. Pt stated that it started sometime last night. Pt states pain increases with exertion.

## 2020-10-07 NOTE — ED Provider Notes (Signed)
[facility] EMERGENCY DEPARTMENT Provider Note  CSN: [id number] Arrival date & time: 10/07/20 0827    History Chief Complaint  Patient presents with   Back Pain    Angela Blake is a 25 y.o. female reports she was in her usual state of health last night when she went to bed. She got up to use the restroom around 2330 without any problems but when she got back in bed, she was rolling over and felt a sudden pain in her L lower back/buttock, radiates to her L posterior thigh. Denies any specific injuries, she does have a left sided black eye she reports is from her 140lb dog jumping on her a few days ago, but denies any assault or other injuries. She denies any fever, incontinence or weakness/numbness in legs. No history of IVDA. Patient denies possibility of pregnancy.   Past Medical History:  Diagnosis Date   Appendicitis 01/26/2016   CLOS FRACTURE MID/PROXIMAL PHALANX/PHALANG HAND 03/24/2010   Qualifier: Diagnosis of  By: Romeo Apple MD, [place]     Hamstring muscle strain 08/04/2011   Medical history non-contributory    OTHER CLOSED FRACTURES OF DISTAL END OF RADIUS 10/30/2009   Qualifier: Diagnosis of  By: Romeo Apple MD, [place]     Right ankle sprain 02/09/2012   SPRAIN AND STRAIN OF INTERPHALANGEAL OF HAND 03/24/2010   Qualifier: Diagnosis of  By: Romeo Apple MD, Bryon Lions, ANKLE NOS 01/26/2007   Qualifier: Diagnosis of  By: Romeo Apple MD, Duffy Rhody      Past Surgical History:  Procedure Laterality Date   LAPAROSCOPIC APPENDECTOMY N/A 01/26/2016   Procedure: APPENDECTOMY LAPAROSCOPIC;  Surgeon: Ancil Linsey, MD;  Location: [facility];  Service: General;  Laterality: N/A;    History reviewed. No pertinent family history.  Social History   Tobacco Use   Smoking status: Never   Smokeless tobacco: Never  Vaping Use   Vaping Use: Never used  Substance Use Topics   Alcohol use: Yes    Alcohol/week: 0.0 standard drinks    Comment: Occ   Drug use: No      Home Medications Prior to Admission medications   Medication Sig Start Date End Date Taking? Authorizing Provider  methocarbamol (ROBAXIN) 500 MG tablet Take 1 tablet (500 mg total) by mouth 2 (two) times daily. 10/07/20  Yes Pollyann Savoy, MD  cetirizine (ZYRTEC) 10 MG tablet Take 1 tablet (10 mg total) by mouth daily for 30 days. 05/23/18 06/22/18  Benay Pike, NP  etonogestrel (NEXPLANON) 68 MG IMPL implant Nexplanon 68 mg subdermal implant  Inject 1 implant by subcutaneous route.    [provider]  fluticasone (FLONASE) 50 MCG/ACT nasal spray Place 2 sprays into both nostrils daily for 10 days. 05/23/18 06/02/18  Benay Pike, NP  naproxen (NAPROSYN) 500 MG tablet Take 1 tablet (500 mg total) by mouth 2 (two) times daily. 10/07/20   Pollyann Savoy, MD     Allergies    Patient has no known allergies.   Review of Systems   Review of Systems A comprehensive review of systems was completed and negative except as noted in HPI.    Physical Exam BP 125/76 (BP Location: Right Arm)   Pulse 90   Temp 98.1 F (36.7 C) (Oral)   Resp 18   LMP 09/03/2020 (Approximate)   SpO2 99%   Physical Exam Vitals and nursing note reviewed.  Constitutional:      Appearance: Normal appearance.  HENT:  Head: Normocephalic and atraumatic.     Nose: Nose normal.     Mouth/Throat:     Mouth: Mucous membranes are moist.  Eyes:     Extraocular Movements: Extraocular movements intact.     Conjunctiva/sclera: Conjunctivae normal.  Cardiovascular:     Rate and Rhythm: Normal rate.  Pulmonary:     Effort: Pulmonary effort is normal.     Breath sounds: Normal breath sounds.  Abdominal:     General: Abdomen is flat.     Palpations: Abdomen is soft.     Tenderness: There is no abdominal tenderness.  Musculoskeletal:        General: Tenderness (L lower back/buttock) present. No swelling. Normal range of motion.     Cervical back: Neck supple.  Skin:     General: Skin is warm and dry.  Neurological:     General: No focal deficit present.     Mental Status: She is alert and oriented to person, place, and time.     Cranial Nerves: No cranial nerve deficit.     Sensory: No sensory deficit.     Motor: No weakness.     Gait: Gait normal.     Deep Tendon Reflexes: Reflexes normal.  Psychiatric:        Mood and Affect: Mood normal.     ED Results / Procedures / Treatments   Labs (all labs ordered are listed, but only abnormal results are displayed) Labs Reviewed - No data to display  EKG None   Radiology No results found.  Procedures Procedures  Medications Ordered in the ED Medications  ketorolac (TORADOL) 30 MG/ML injection 60 mg (has no administration in time range)     MDM Rules/Calculators/A&P MDM Patient with left sided sciatica symptoms. No red flags. Will give Toradol in the ED, plan Rx for Naprosyn, Robaxin and PCP follow up if not improving.   ED Course  I have reviewed the triage vital signs and the nursing notes.  Pertinent labs & imaging results that were available during my care of the patient were reviewed by me and considered in my medical decision making (see chart for details).     Final Clinical Impression(s) / ED Diagnoses Final diagnoses:  Acute left-sided low back pain with left-sided sciatica    Rx / DC Orders ED Discharge Orders          Ordered    naproxen (NAPROSYN) 500 MG tablet  2 times daily        10/07/20 0909    methocarbamol (ROBAXIN) 500 MG tablet  2 times daily        10/07/20 0909             Pollyann Savoy, MD 10/07/20 [phone]

## 2020-11-18 ENCOUNTER — Telehealth: Payer: 59 | Admitting: Physician Assistant

## 2020-11-18 DIAGNOSIS — U071 COVID-19: Secondary | ICD-10-CM | POA: Diagnosis not present

## 2020-11-18 MED ORDER — LIDOCAINE VISCOUS HCL 2 % MT SOLN: 15.0000 mL | OROMUCOSAL | 0 refills | Status: AC | PRN

## 2020-11-18 MED ORDER — FLUTICASONE PROPIONATE 50 MCG/ACT NA SUSP: 2.0000 | Freq: Every day | NASAL | 0 refills | Status: AC

## 2020-11-18 NOTE — Patient Instructions (Signed)
  Electa Sniff, thank you for joining Piedad Climes, PA-C for today's virtual visit.  While this provider is not your primary care provider (PCP), if your PCP is located in our provider database this encounter information will be shared with them immediately following your visit.  Consent: (Patient) Electa Sniff provided verbal consent for this virtual visit at the beginning of the encounter.  Current Medications:  Current Outpatient Medications:    cetirizine (ZYRTEC) 10 MG tablet, Take 1 tablet (10 mg total) by mouth daily for 30 days., Disp: 30 tablet, Rfl: 0   etonogestrel (NEXPLANON) 68 MG IMPL implant, Nexplanon 68 mg subdermal implant  Inject 1 implant by subcutaneous route., Disp: , Rfl:    fluticasone (FLONASE) 50 MCG/ACT nasal spray, Place 2 sprays into both nostrils daily for 10 days., Disp: 16 g, Rfl: 0   methocarbamol (ROBAXIN) 500 MG tablet, Take 1 tablet (500 mg total) by mouth 2 (two) times daily., Disp: 20 tablet, Rfl: 0   naproxen (NAPROSYN) 500 MG tablet, Take 1 tablet (500 mg total) by mouth 2 (two) times daily., Disp: 14 tablet, Rfl: 0   Medications ordered in this encounter:  No orders of the defined types were placed in this encounter.    *If you need refills on other medications prior to your next appointment, please contact your pharmacy*  Follow-Up: Call back or seek an in-person evaluation if the symptoms worsen or if the condition fails to improve as anticipated.  Other Instructions Please keep well-hydrated and get plenty of rest. Start a saline nasal rinse to flush out your nasal passages. You can use plain Mucinex to help thin congestion. If you have a humidifier, running in the bedroom at night. I want you to start OTC vitamin D3 1000 units daily, vitamin C 1000 mg daily, and a zinc supplement. Please take prescribed medications as directed.  You have been enrolled in a MyChart symptom monitoring program. Please answer these questions daily so  we can keep track of how you are doing.  You were to quarantine for 5 days from onset of your symptoms.  After day 5, if you have had no fever and you are feeling better, you can end quarantine but need to mask for an additional 5 days. After day 5 if you have a fever or are having significant symptoms, please quarantine for full 10 days.  If you note any worsening of symptoms, any significant shortness of breath or any chest pain, please seek ER evaluation ASAP.  Please do not delay care!    If you have been instructed to have an in-person evaluation today at a local Urgent Care facility, please use the link below. It will take you to a list of all of our available [facility], including address, phone number and hours of operation. Please do not delay care.  [facility]  If you or a family member do not have a primary care provider, use the link below to schedule a visit and establish care. When you choose a [facility] primary care physician or advanced practice provider, you gain a long-term partner in health. Find a Primary Care Provider  Learn more about [facility]'s in-office and virtual care options: [facility] - Get Care Now

## 2020-11-18 NOTE — Progress Notes (Signed)
Virtual Visit Consent   Angela Blake, you are scheduled for a virtual visit with a [facility] provider today.     Just as with appointments in the office, your consent must be obtained to participate.  Your consent will be active for this visit and any virtual visit you may have with one of our providers in the next 365 days.     If you have a MyChart account, a copy of this consent can be sent to you electronically.  All virtual visits are billed to your insurance company just like a traditional visit in the office.    As this is a virtual visit, video technology does not allow for your provider to perform a traditional examination.  This may limit your provider's ability to fully assess your condition.  If your provider identifies any concerns that need to be evaluated in person or the need to arrange testing (such as labs, EKG, etc.), we will make arrangements to do so.     Although advances in technology are sophisticated, we cannot ensure that it will always work on either your end or our end.  If the connection with a video visit is poor, the visit may have to be switched to a telephone visit.  With either a video or telephone visit, we are not always able to ensure that we have a secure connection.     I need to obtain your verbal consent now.   Are you willing to proceed with your visit today?    Angela Blake has provided verbal consent on 11/18/2020 for a virtual visit (video or telephone).   Piedad Climes, [place]   Date: 11/18/2020 3:09 PM   Virtual Visit via Video Note   I, Piedad Climes, connected with  Angela Blake  ([id number], 08/28/1995) on 11/18/20 at  3:00 PM EDT by a video-enabled telemedicine application and verified that I am speaking with the correct person using two identifiers.  Location: Patient: Virtual Visit Location Patient: Home Provider: Virtual Visit Location Provider: Home Office   I discussed the limitations of evaluation and management by  telemedicine and the availability of in person appointments. The patient expressed understanding and agreed to proceed.    History of Present Illness: Angela Blake is a 25 y.o. who identifies as a female who was assigned female at birth, and is being seen today for COVID-19. Patient endorses symptoms starting early this morning with headache, chills and bilateral sore throat. Also noting mild congestion and body aches. Denies SOB, chest pain, lightheadedness or dizziness. Denies known sick contacts. At home test this morning came back positive.   HPI: HPI  Problems:  Patient Active Problem List   Diagnosis Date Noted   Nexplanon in place 08/11/2017    Allergies: No Known Allergies Medications:  Current Outpatient Medications:    fluticasone (FLONASE) 50 MCG/ACT nasal spray, Place 2 sprays into both nostrils daily., Disp: 16 g, Rfl: 0   lidocaine (XYLOCAINE) 2 % solution, Use as directed 15 mLs in the mouth or throat as needed for mouth pain., Disp: 200 mL, Rfl: 0   etonogestrel (NEXPLANON) 68 MG IMPL implant, Nexplanon 68 mg subdermal implant  Inject 1 implant by subcutaneous route., Disp: , Rfl:   Observations/Objective: Patient is well-developed, well-nourished in no acute distress.  Resting comfortably at home.  Head is normocephalic, atraumatic.  No labored breathing. Speech is clear and coherent with logical content.  Patient is alert and oriented at baseline.  Assessment and Plan: 1. COVID-19 - fluticasone (FLONASE) 50 MCG/ACT nasal spray; Place 2 sprays into both nostrils daily.  Dispense: 16 g; Refill: 0 - lidocaine (XYLOCAINE) 2 % solution; Use as directed 15 mLs in the mouth or throat as needed for mouth pain.  Dispense: 200 mL; Refill: 0 Low risk of complications with milder symptoms. Main issue at present is bilateral sore throat. No indication for antiviral which was reviewed with patient. Supportive measures, OTC medications and Vitamin regimen reviewed. Will Rx Flonase  and viscous lidocaine. Patient has been enrolled in a symptom monitoring program via MyChart. Strict ER precautions reviewed.   Follow Up Instructions: I discussed the assessment and treatment plan with the patient. The patient was provided an opportunity to ask questions and all were answered. The patient agreed with the plan and demonstrated an understanding of the instructions.  A copy of instructions were sent to the patient via MyChart.  The patient was advised to call back or seek an in-person evaluation if the symptoms worsen or if the condition fails to improve as anticipated.  Time:  I spent 15 minutes with the patient via telehealth technology discussing the above problems/concerns.    Piedad Climes, PA-C

## 2020-12-02 DIAGNOSIS — Z113 Encounter for screening for infections with a predominantly sexual mode of transmission: Secondary | ICD-10-CM | POA: Diagnosis not present

## 2020-12-02 DIAGNOSIS — Z3040 Encounter for surveillance of contraceptives, unspecified: Secondary | ICD-10-CM | POA: Diagnosis not present

## 2020-12-02 DIAGNOSIS — Z01419 Encounter for gynecological examination (general) (routine) without abnormal findings: Secondary | ICD-10-CM | POA: Diagnosis not present

## 2020-12-02 DIAGNOSIS — Z1389 Encounter for screening for other disorder: Secondary | ICD-10-CM | POA: Diagnosis not present

## 2020-12-24 DIAGNOSIS — L219 Seborrheic dermatitis, unspecified: Secondary | ICD-10-CM | POA: Diagnosis not present

## 2021-05-21 NOTE — Progress Notes (Signed)
Angela [facility] D.[facility] [address] [place] [address] Phone: [phone]   Assessment and Plan:     1. Strain of right tibialis anterior muscle, initial encounter -Acute, uncomplicated, initial sports medicine visit - Likely strain of right tibialis anterior muscle and tendon based on HPI, physical exam - Negative [place] ankle rules, so no imaging taken today -- Start meloxicam 15 mg daily x2 weeks.  If still having pain after 2 weeks, complete 3rd-week of meloxicam. May use remaining meloxicam as needed once daily for pain control.  Do not to use additional NSAIDs while taking meloxicam.  May use Tylenol [phone] mg to 3 times a day for breakthrough pain. - Start HEP and avoid painful exercises   Pertinent previous records reviewed include none   Follow Up: 3 to 4 weeks for reevaluation.  Could consider x-ray versus ultrasound imaging of ankle versus tib-fib if no improvement or worsening of symptoms   Subjective:   I, Angela Blake, am serving as a Neurosurgeon for [employer]  Chief Complaint: lower leg pain   HPI:  05/22/2021 Patient is a 26 year old female complaining of lower leg pain . Patient states  That she just started going to the gym 3 weeks ago she noticed some pain 3-4 days ago the pain began to be constant feels it doing minor tasks has been icing and compression wrapping it and no help, no numbness tingling , pain radiates up the leg starts at the middle top of the foot and ends about middle of the shin. Has been taking Advil doesn't really know if it makes a difference. She states when she is going to do a squat the pressure through her heel is what makes the pain the worst, when she  flexes the wrong way that's when the pain is at its peak.   Relevant Historical Information: None pertinent  Additional pertinent review of systems negative.   Current Outpatient Medications:    etonogestrel (NEXPLANON) 68 MG  IMPL implant, Nexplanon 68 mg subdermal implant  Inject 1 implant by subcutaneous route., Disp: , Rfl:    fluticasone (FLONASE) 50 MCG/ACT nasal spray, Place 2 sprays into both nostrils daily., Disp: 16 g, Rfl: 0   lidocaine (XYLOCAINE) 2 % solution, Use as directed 15 mLs in the mouth or throat as needed for mouth pain., Disp: 200 mL, Rfl: 0   meloxicam (MOBIC) 15 MG tablet, Take 1 tablet (15 mg total) by mouth daily., Disp: 30 tablet, Rfl: 0   Objective:     Vitals:   05/22/21 0812  BP: 120/80  Pulse: 97  SpO2: 97%  Weight: 168 lb (76.2 kg)  Height: 5\' 9"  (1.753 m)      Body mass index is 24.81 kg/m.    Physical Exam:    Gen: Appears well, nad, nontoxic and pleasant Psych: Alert and oriented, appropriate mood and affect Neuro: sensation intact, strength is 5/5 with df/pf/inv/ev, muscle tone wnl Skin: no susupicious lesions or rashes  Ankle: no deformity, no swelling or effusion TTP anterior tibialis muscle and tendon NTTP over fibular head, lat mal, medial mal, achilles, navicular, base of 5th, ATFL, CFL, deltoid, calcaneous or midfoot ROM DF 30, PF 45, inv/ev intact Negative ant drawer, talar tilt, rotation test, squeeze test. Neg thompson No pain with resisted inversion or eversion  Pain over anterior ankle with resisted plantarflexion and worse with resisted dorsiflexion  Electronically signed by:  D.[facility] 8:32 AM 05/22/21

## 2021-05-22 ENCOUNTER — Other Ambulatory Visit (HOSPITAL_COMMUNITY): Payer: Self-pay

## 2021-05-22 ENCOUNTER — Ambulatory Visit (INDEPENDENT_AMBULATORY_CARE_PROVIDER_SITE_OTHER): Payer: 59 | Admitting: Sports Medicine

## 2021-05-22 ENCOUNTER — Other Ambulatory Visit: Payer: Self-pay

## 2021-05-22 VITALS — BP 120/80 | HR 97 | Ht 69.0 in | Wt 168.0 lb

## 2021-05-22 DIAGNOSIS — S86811A Strain of other muscle(s) and tendon(s) at lower leg level, right leg, initial encounter: Secondary | ICD-10-CM

## 2021-05-22 MED ORDER — MELOXICAM 15 MG PO TABS
15.0000 mg | ORAL_TABLET | Freq: Every day | ORAL | 0 refills | Status: AC
  Filled 2021-05-22: qty 30, 30d supply, fill #0

## 2021-05-22 NOTE — Patient Instructions (Addendum)
Good to see you Start meloxicam 15 mg daily x2 weeks.  If still having pain after 2 weeks, complete 3rd-week of meloxicam. May use remaining meloxicam as needed once daily for pain control.  Do not to use additional NSAIDs while taking meloxicam.  May use Tylenol [phone] mg to 3 times a day for breakthrough pain. Ankle HEP  Recommend relative rest avoiding painful exercises 3-4 week follow up

## 2021-06-11 NOTE — Progress Notes (Deleted)
? ?   Angela Blake D.Judd Gaudier ?[facility] ?[address] [place] [address] ?Phone: [phone] ?  ?Assessment and Plan:   ?  ?There are no diagnoses linked to this encounter.  ?*** ?  ?Pertinent previous records reviewed include *** ?  ?Follow Up: ***  ? ?  ?Subjective:   ?I, Angela Blake, am serving as a Neurosurgeon for [employer] ? ?Chief Complaint: lower leg pain  ? ?HPI:  ?05/22/2021 ?Patient is a 26 year old female complaining of lower leg pain . Patient states  ?That she just started going to the gym 3 weeks ago she noticed some pain 3-4 days ago the pain began to be constant feels it doing minor tasks has been icing and compression wrapping it and no help, no numbness tingling , pain radiates up the leg starts at the middle top of the foot and ends about middle of the shin. Has been taking Advil doesn't really know if it makes a difference. She states when she is going to do a squat the pressure through her heel is what makes the pain the worst, when she  flexes the wrong way that's when the pain is at its peak. ? ?06/12/2021 ?Patient states ? ?Relevant Historical Information: *** ? ?Additional pertinent review of systems negative. ? ? ?Current Outpatient Medications:  ?  etonogestrel (NEXPLANON) 68 MG IMPL implant, Nexplanon 68 mg subdermal implant  Inject 1 implant by subcutaneous route., Disp: , Rfl:  ?  fluticasone (FLONASE) 50 MCG/ACT nasal spray, Place 2 sprays into both nostrils daily., Disp: 16 g, Rfl: 0 ?  lidocaine (XYLOCAINE) 2 % solution, Use as directed 15 mLs in the mouth or throat as needed for mouth pain., Disp: 200 mL, Rfl: 0 ?  meloxicam (MOBIC) 15 MG tablet, Take 1 tablet (15 mg total) by mouth daily., Disp: 30 tablet, Rfl: 0  ? ?Objective:   ?  ?There were no vitals filed for this visit.  ?  ?There is no height or weight on file to calculate BMI.  ?  ?Physical Exam:   ? ?*** ? ? ?Electronically signed by:  ?Angela Blake D.Judd Gaudier ?[facility] ?8:58 AM 06/11/21 ?

## 2021-06-12 ENCOUNTER — Ambulatory Visit: Payer: 59 | Admitting: Sports Medicine

## 2021-06-28 ENCOUNTER — Telehealth: Payer: 59 | Admitting: Nurse Practitioner

## 2021-06-28 DIAGNOSIS — R6889 Other general symptoms and signs: Secondary | ICD-10-CM | POA: Diagnosis not present

## 2021-06-28 DIAGNOSIS — J111 Influenza due to unidentified influenza virus with other respiratory manifestations: Secondary | ICD-10-CM | POA: Diagnosis not present

## 2021-06-28 MED ORDER — OSELTAMIVIR PHOSPHATE 75 MG PO CAPS: 75.0000 mg | ORAL_CAPSULE | Freq: Two times a day (BID) | ORAL | 0 refills | Status: AC

## 2021-06-28 MED ORDER — ONDANSETRON 4 MG PO TBDP: 4.0000 mg | ORAL_TABLET | Freq: Three times a day (TID) | ORAL | 0 refills | Status: AC | PRN

## 2021-06-28 NOTE — Patient Instructions (Signed)
?  Angela Blake, thank you for joining Gildardo Pounds, NP for today's virtual visit.  While this provider is not your primary care provider (PCP), if your PCP is located in our provider database this encounter information will be shared with them immediately following your visit. ? ?Consent: ?(Patient) Angela Blake provided verbal consent for this virtual visit at the beginning of the encounter. ? ?Current Medications: ? ?Current Outpatient Medications:  ?  ondansetron (ZOFRAN-ODT) 4 MG disintegrating tablet, Take 1 tablet (4 mg total) by mouth every 8 (eight) hours as needed for nausea or vomiting., Disp: 21 tablet, Rfl: 0 ?  oseltamivir (TAMIFLU) 75 MG capsule, Take 1 capsule (75 mg total) by mouth 2 (two) times daily for 5 days., Disp: 10 capsule, Rfl: 0 ?  etonogestrel (NEXPLANON) 68 MG IMPL implant, Nexplanon 68 mg subdermal implant  Inject 1 implant by subcutaneous route., Disp: , Rfl:  ?  fluticasone (FLONASE) 50 MCG/ACT nasal spray, Place 2 sprays into both nostrils daily., Disp: 16 g, Rfl: 0 ?  lidocaine (XYLOCAINE) 2 % solution, Use as directed 15 mLs in the mouth or throat as needed for mouth pain., Disp: 200 mL, Rfl: 0 ?  meloxicam (MOBIC) 15 MG tablet, Take 1 tablet (15 mg total) by mouth daily., Disp: 30 tablet, Rfl: 0  ? ?Medications ordered in this encounter:  ?Meds ordered this encounter  ?Medications  ? oseltamivir (TAMIFLU) 75 MG capsule  ?  Sig: Take 1 capsule (75 mg total) by mouth 2 (two) times daily for 5 days.  ?  Dispense:  10 capsule  ?  Refill:  0  ?  Order Specific Question:   Supervising Provider  ?  Answer:   Noemi Chapel [[id number]]  ? ondansetron (ZOFRAN-ODT) 4 MG disintegrating tablet  ?  Sig: Take 1 tablet (4 mg total) by mouth every 8 (eight) hours as needed for nausea or vomiting.  ?  Dispense:  21 tablet  ?  Refill:  0  ?  Order Specific Question:   Supervising Provider  ?  Answer:   Noemi Chapel [[id number]]  ?  ? ?*If you need refills on other medications prior to your next  appointment, please contact your pharmacy* ? ?Follow-Up: ?Call back or seek an in-person evaluation if the symptoms worsen or if the condition fails to improve as anticipated. ? ?Other Instructions ?INSTRUCTIONS: use a humidifier for nasal congestion ?Drink plenty of fluids, rest and wash hands frequently to avoid the spread of infection ?Alternate tylenol and Motrin for relief of fever  ? ? ?If you have been instructed to have an in-person evaluation today at a local Urgent Care facility, please use the link below. It will take you to a list of all of our available [facility], including address, phone number and hours of operation. Please do not delay care.  ?[facility] ? ?If you or a family member do not have a primary care provider, use the link below to schedule a visit and establish care. When you choose a [facility] primary care physician or advanced practice provider, you gain a long-term partner in health. ?Find a Primary Care Provider ? ?Learn more about 's in-office and virtual care options: ?[facility]  ?

## 2021-06-28 NOTE — Progress Notes (Signed)
?Virtual Visit Consent  ? ?Dixie Dials, you are scheduled for a virtual visit with a [facility] provider today.   ?  ?Just as with appointments in the office, your consent must be obtained to participate.  Your consent will be active for this visit and any virtual visit you may have with one of our providers in the next 365 days.   ?  ?If you have a MyChart account, a copy of this consent can be sent to you electronically.  All virtual visits are billed to your insurance company just like a traditional visit in the office.   ? ?As this is a virtual visit, video technology does not allow for your provider to perform a traditional examination.  This may limit your provider's ability to fully assess your condition.  If your provider identifies any concerns that need to be evaluated in person or the need to arrange testing (such as labs, EKG, etc.), we will make arrangements to do so.   ?  ?Although advances in technology are sophisticated, we cannot ensure that it will always work on either your end or our end.  If the connection with a video visit is poor, the visit may have to be switched to a telephone visit.  With either a video or telephone visit, we are not always able to ensure that we have a secure connection.    ? ?I need to obtain your verbal consent now.   Are you willing to proceed with your visit today?  ?  ?Angela Blake has provided verbal consent on 06/28/2021 for a virtual visit (video or telephone). ?  ?Gildardo Pounds, NP  ? ?Date: 06/28/2021 12:29 PM ? ? ?Virtual Visit via Video Note  ? ?IGildardo Pounds, connected with  Angela Blake  ([id number], 1996/01/27) on 06/28/21 at 12:00 PM EDT by a video-enabled telemedicine application and verified that I am speaking with the correct person using two identifiers. ? ?Location: ?Patient: Virtual Visit Location Patient: Home ?Provider: Virtual Visit Location Provider: Home Office ?  ?I discussed the limitations of evaluation and management by  telemedicine and the availability of in person appointments. The patient expressed understanding and agreed to proceed.   ? ?History of Present Illness: ?Angela Blake is a 26 y.o. who identifies as a female who was assigned female at birth, and is being seen today for FLU LIKE SYMPTOMS ? ?Negative for Home COVID test taken today. Notes 2 day onset of body aches, nasuea, decreased appetite, sore throat, productive cough, headache, chills and fever (but can not recall last temperature). Currently taking OTC analgesics.  ? ?Problems:  ?Patient Active Problem List  ? Diagnosis Date Noted  ? Nexplanon in place 08/11/2017  ?  ?Allergies: No Known Allergies ?Medications:  ?Current Outpatient Medications:  ?  ondansetron (ZOFRAN-ODT) 4 MG disintegrating tablet, Take 1 tablet (4 mg total) by mouth every 8 (eight) hours as needed for nausea or vomiting., Disp: 21 tablet, Rfl: 0 ?  oseltamivir (TAMIFLU) 75 MG capsule, Take 1 capsule (75 mg total) by mouth 2 (two) times daily for 5 days., Disp: 10 capsule, Rfl: 0 ?  etonogestrel (NEXPLANON) 68 MG IMPL implant, Nexplanon 68 mg subdermal implant  Inject 1 implant by subcutaneous route., Disp: , Rfl:  ?  fluticasone (FLONASE) 50 MCG/ACT nasal spray, Place 2 sprays into both nostrils daily., Disp: 16 g, Rfl: 0 ?  lidocaine (XYLOCAINE) 2 % solution, Use as directed 15 mLs in the mouth or  throat as needed for mouth pain., Disp: 200 mL, Rfl: 0 ?  meloxicam (MOBIC) 15 MG tablet, Take 1 tablet (15 mg total) by mouth daily., Disp: 30 tablet, Rfl: 0 ? ?Observations/Objective: ?Patient is well-developed, well-nourished in no acute distress.  ?Resting comfortably at home.  ?Head is normocephalic, atraumatic.  ?No labored breathing.  ?Speech is clear and coherent with logical content.  ?Patient is alert and oriented at baseline.  ? ? ?Assessment and Plan: ?1. Flu-like symptoms ?- oseltamivir (TAMIFLU) 75 MG capsule; Take 1 capsule (75 mg total) by mouth 2 (two) times daily for 5 days.   Dispense: 10 capsule; Refill: 0 ?- ondansetron (ZOFRAN-ODT) 4 MG disintegrating tablet; Take 1 tablet (4 mg total) by mouth every 8 (eight) hours as needed for nausea or vomiting.  Dispense: 21 tablet; Refill: 0 ?INSTRUCTIONS: use a humidifier for nasal congestion ?Drink plenty of fluids, rest and wash hands frequently to avoid the spread of infection ?Alternate tylenol and Motrin for relief of fever  ? ?Follow Up Instructions: ?I discussed the assessment and treatment plan with the patient. The patient was provided an opportunity to ask questions and all were answered. The patient agreed with the plan and demonstrated an understanding of the instructions.  A copy of instructions were sent to the patient via MyChart unless otherwise noted below.  ? ? ? ?The patient was advised to call back or seek an in-person evaluation if the symptoms worsen or if the condition fails to improve as anticipated. ? ?Time:  ?I spent 12 minutes with the patient via telehealth technology discussing the above problems/concerns.   ? ?Gildardo Pounds, NP  ?

## 2021-08-17 DIAGNOSIS — Z309 Encounter for contraceptive management, unspecified: Secondary | ICD-10-CM | POA: Diagnosis not present

## 2021-08-17 DIAGNOSIS — Z3046 Encounter for surveillance of implantable subdermal contraceptive: Secondary | ICD-10-CM | POA: Diagnosis not present

## 2022-11-03 ENCOUNTER — Other Ambulatory Visit: Payer: Self-pay

## 2022-11-03 ENCOUNTER — Encounter (HOSPITAL_COMMUNITY): Payer: Self-pay | Admitting: Emergency Medicine

## 2022-11-03 ENCOUNTER — Ambulatory Visit (HOSPITAL_COMMUNITY)
Admission: EM | Admit: 2022-11-03 | Discharge: 2022-11-03 | Disposition: A | Discharge status: Home / Self Care | Payer: BC Managed Care – PPO | Attending: Emergency Medicine | Admitting: Emergency Medicine

## 2022-11-03 DIAGNOSIS — S80812A Abrasion, left lower leg, initial encounter: Secondary | ICD-10-CM

## 2022-11-03 MED ORDER — CHLORHEXIDINE GLUCONATE 4 % EX SOLN: Freq: Every day | CUTANEOUS | 0 refills | Status: AC | PRN

## 2022-11-03 MED ORDER — MUPIROCIN 2 % EX OINT: 1.0000 | TOPICAL_OINTMENT | Freq: Two times a day (BID) | CUTANEOUS | 1 refills | Status: AC

## 2022-11-03 NOTE — ED Triage Notes (Signed)
10/31/2022, was playing softball.  Slid into a base plate .  Patient has a large abrasion to left lateral lower leg and a smaller, but significant area to left upper leg  Patient has been taking advil and using neosporin (neosporin is stinging when applied to wound)

## 2022-11-03 NOTE — ED Provider Notes (Signed)
[facility]    CSN: [id number] Arrival date & time: 11/03/22  4742      History   Chief Complaint Chief Complaint  Patient presents with   Leg Injury    HPI Angela Blake is a 27 y.o. female.   Patient presents to clinic with a large abrasion to her left lower leg and left upper leg that she got on Sunday after sliding into a base while playing softball.  She did not have any clothing protecting her leg, clean the area afterwards with water.  She has been putting Neosporin on the left calf abrasion with no dressing would like to ensure she is doing the right wound care and that her wound is not infected.  The area to her left upper leg is scabbed over and dry, open with no dressing.   She has been taking ibuprofen as needed for pain.  She denies any fevers, warmth, streaking or swelling.  Ambulatory.     The history is provided by the patient and medical records.    Past Medical History:  Diagnosis Date   Appendicitis 01/26/2016   CLOS FRACTURE MID/PROXIMAL PHALANX/PHALANG HAND 03/24/2010   Qualifier: Diagnosis of  By: Romeo Apple MD, [place]     Hamstring muscle strain 08/04/2011   Medical history non-contributory    OTHER CLOSED FRACTURES OF DISTAL END OF RADIUS 10/30/2009   Qualifier: Diagnosis of  By: Romeo Apple MD, [place]     Right ankle sprain 02/09/2012   SPRAIN AND STRAIN OF INTERPHALANGEAL OF HAND 03/24/2010   Qualifier: Diagnosis of  By: Romeo Apple MD, Bryon Lions, ANKLE NOS 01/26/2007   Qualifier: Diagnosis of  By: Romeo Apple MD, [place]      Patient Active Problem List   Diagnosis Date Noted   Nexplanon in place 08/11/2017    Past Surgical History:  Procedure Laterality Date   LAPAROSCOPIC APPENDECTOMY N/A 01/26/2016   Procedure: APPENDECTOMY LAPAROSCOPIC;  Surgeon: Ancil Linsey, MD;  Location: [facility];  Service: General;  Laterality: N/A;    OB History   No obstetric history on file.      Home Medications    Prior to  Admission medications   Medication Sig Start Date End Date Taking? Authorizing Provider  chlorhexidine (HIBICLENS) 4 % external liquid Apply topically daily as needed. 11/03/22  Yes Rinaldo Ratel, [place] N, FNP  mupirocin ointment (BACTROBAN) 2 % Apply 1 Application topically 2 (two) times daily. 11/03/22  Yes Rinaldo Ratel, [place] N, FNP  etonogestrel (NEXPLANON) 68 MG IMPL implant Nexplanon 68 mg subdermal implant  Inject 1 implant by subcutaneous route.    [provider]  fluticasone (FLONASE) 50 MCG/ACT nasal spray Place 2 sprays into both nostrils daily. 11/18/20   Waldon Merl, PA-C  lidocaine (XYLOCAINE) 2 % solution Use as directed 15 mLs in the mouth or throat as needed for mouth pain. Patient not taking: Reported on 11/03/2022 11/18/20   Waldon Merl, PA-C  meloxicam (MOBIC) 15 MG tablet Take 1 tablet (15 mg total) by mouth daily. 05/22/21   Richardean Sale, DO  ondansetron (ZOFRAN-ODT) 4 MG disintegrating tablet Take 1 tablet (4 mg total) by mouth every 8 (eight) hours as needed for nausea or vomiting. 06/28/21   Claiborne Rigg, NP    Family History History reviewed. No pertinent family history.  Social History Social History   Tobacco Use   Smoking status: Never   Smokeless tobacco: Never  Vaping Use   Vaping status: Never Used  Substance Use Topics   Alcohol use: Yes    Alcohol/week: 0.0 standard drinks of alcohol    Comment: Occ   Drug use: No     Allergies   Patient has no known allergies.   Review of Systems Review of Systems  Constitutional:  Negative for fever.  Skin:  Positive for wound.     Physical Exam Triage Vital Signs ED Triage Vitals  Encounter Vitals Group     BP 11/03/22 1037 121/82     Systolic BP Percentile --      Diastolic BP Percentile --      Pulse Rate 11/03/22 1037 79     Resp 11/03/22 1037 18     Temp 11/03/22 1037 98.3 F (36.8 C)     Temp Source 11/03/22 1037 Oral     SpO2 11/03/22 1037 97 %     Weight --       Height --      Head Circumference --      Peak Flow --      Pain Score 11/03/22 1034 4     Pain Loc --      Pain Education --      Exclude from Growth Chart --    No data found.  Updated Vital Signs BP 121/82 (BP Location: Right Arm)   Pulse 79   Temp 98.3 F (36.8 C) (Oral)   Resp 18   LMP 08/09/2022   SpO2 97%   Visual Acuity Right Eye Distance:   Left Eye Distance:   Bilateral Distance:    Right Eye Near:   Left Eye Near:    Bilateral Near:     Physical Exam Vitals and nursing note reviewed.  Constitutional:      Appearance: Normal appearance.  HENT:     Head: Normocephalic and atraumatic.     Right Ear: External ear normal.     Left Ear: External ear normal.     Nose: Nose normal.     Mouth/Throat:     Mouth: Mucous membranes are moist.  Eyes:     Conjunctiva/sclera: Conjunctivae normal.  Cardiovascular:     Rate and Rhythm: Normal rate.     Pulses: Normal pulses.  Pulmonary:     Effort: Pulmonary effort is normal. No respiratory distress.  Musculoskeletal:        General: Normal range of motion.     Cervical back: Normal range of motion.  Skin:    General: Skin is warm and dry.     Capillary Refill: Capillary refill takes less than 2 seconds.     Findings: Abrasion present.          Comments: Abrasions to left lower and upper lateral leg.   Neurological:     General: No focal deficit present.     Mental Status: She is alert and oriented to person, place, and time.  Psychiatric:        Mood and Affect: Mood normal.        Behavior: Behavior normal. Behavior is cooperative.      UC Treatments / Results  Labs (all labs ordered are listed, but only abnormal results are displayed) Labs Reviewed - No data to display  EKG   Radiology No results found.  Procedures Procedures (including critical care time)  Medications Ordered in UC Medications - No data to display  Initial Impression / Assessment and Plan / UC Course  I have reviewed  the triage vital signs and the nursing  notes.  Pertinent labs & imaging results that were available during my care of the patient were reviewed by me and considered in my medical decision making (see chart for details).  Vitals in triage reviewed, patient is hemodynamically stable.  Abrasions to left upper and left lower legs do not appear infected, without streaking, warmth or redness that extends beyond the wound borders.  Wound care provided in clinic, advised to keep the left calf wound covered with a nonadherent dressing and antibacterial ointment since the wound is open and weeping.  Left upper thigh wound is scabbed.  Wound care discussed, plan of care, follow-up care and return precautions given, no questions at this time.     Final Clinical Impressions(s) / UC Diagnoses   Final diagnoses:  Abrasion of left leg, initial encounter     Discharge Instructions      I advise cleaning your wound once or twice daily with warm water and the Hibiclens solution.  Afterwards please pat dry and apply the Bactroban ointment.  If you are going outside of your house I recommend covering your wound with the nonadherent dressing to keep it free of contaminants.  This wound may take a few weeks to fully heal.  He can continue to take 800 mg of ibuprofen every 8 hours as needed for pain and discomfort.  Please return to clinic or follow-up with your primary care if you develop fever, warmth, swelling, or streaking that extends beyond the wound borders, as this may indicate infection.      ED Prescriptions     Medication Sig Dispense Auth. Provider   chlorhexidine (HIBICLENS) 4 % external liquid Apply topically daily as needed. 118 mL Rinaldo Ratel, [place] N, FNP   mupirocin ointment (BACTROBAN) 2 % Apply 1 Application topically 2 (two) times daily. 30 g Aniko Finnigan, [place] N, [place]      PDMP not reviewed this encounter.   Caryl Fate, [place] N, [place] 11/03/22 1057

## 2022-11-03 NOTE — Discharge Instructions (Addendum)
I advise cleaning your wound once or twice daily with warm water and the Hibiclens solution.  Afterwards please pat dry and apply the Bactroban ointment.  If you are going outside of your house I recommend covering your wound with the nonadherent dressing to keep it free of contaminants.  This wound may take a few weeks to fully heal.  He can continue to take 800 mg of ibuprofen every 8 hours as needed for pain and discomfort.  Please return to clinic or follow-up with your primary care if you develop fever, warmth, swelling, or streaking that extends beyond the wound borders, as this may indicate infection.

## 2023-06-02 ENCOUNTER — Other Ambulatory Visit (HOSPITAL_BASED_OUTPATIENT_CLINIC_OR_DEPARTMENT_OTHER): Payer: Self-pay
